# Patient Record
Sex: Female | Born: 1975 | Race: White | Hispanic: No | Marital: Single | Smoking: Current every day smoker
Health system: Southern US, Community
[De-identification: ages and names within clinical notes are randomized; demographics above are authoritative.]

## PROBLEM LIST (undated history)

## (undated) DIAGNOSIS — B009 Herpesviral infection, unspecified: Secondary | ICD-10-CM

## (undated) DIAGNOSIS — F319 Bipolar disorder, unspecified: Secondary | ICD-10-CM

## (undated) DIAGNOSIS — F32A Depression, unspecified: Secondary | ICD-10-CM

## (undated) DIAGNOSIS — F909 Attention-deficit hyperactivity disorder, unspecified type: Secondary | ICD-10-CM

## (undated) DIAGNOSIS — F329 Major depressive disorder, single episode, unspecified: Secondary | ICD-10-CM

## (undated) HISTORY — DX: Attention-deficit hyperactivity disorder, unspecified type: F90.9

## (undated) HISTORY — DX: Depression, unspecified: F32.A

## (undated) HISTORY — DX: Bipolar disorder, unspecified: F31.9

## (undated) HISTORY — PX: INDUCED ABORTION: SHX677

## (undated) HISTORY — DX: Major depressive disorder, single episode, unspecified: F32.9

---

## 2006-07-22 ENCOUNTER — Emergency Department (HOSPITAL_COMMUNITY): Admission: EM | Admit: 2006-07-22 | Discharge: 2006-07-22 | Payer: Self-pay | Admitting: Emergency Medicine

## 2007-04-27 ENCOUNTER — Inpatient Hospital Stay: Payer: Self-pay | Admitting: Unknown Physician Specialty

## 2007-05-01 ENCOUNTER — Other Ambulatory Visit: Payer: Self-pay

## 2008-11-10 ENCOUNTER — Emergency Department: Payer: Self-pay | Admitting: Internal Medicine

## 2008-12-23 ENCOUNTER — Emergency Department (HOSPITAL_COMMUNITY): Admission: EM | Admit: 2008-12-23 | Discharge: 2008-12-23 | Payer: Self-pay | Admitting: Emergency Medicine

## 2009-06-28 ENCOUNTER — Emergency Department: Payer: Self-pay | Admitting: Emergency Medicine

## 2009-11-12 ENCOUNTER — Emergency Department (HOSPITAL_COMMUNITY): Admission: EM | Admit: 2009-11-12 | Discharge: 2009-11-12 | Payer: Self-pay | Admitting: Emergency Medicine

## 2010-01-01 ENCOUNTER — Ambulatory Visit: Payer: Self-pay | Admitting: Unknown Physician Specialty

## 2012-03-22 IMAGING — CR DG CHEST 2V
1 series · 2 of 2 positions shown · non-contrast
Comparison: none

REASON FOR EXAM: bronchitis cough
COMMENTS:

[Series 1: view not recorded · 0.17mm/px · 2 of 2 slices shown]
[im 1/2]
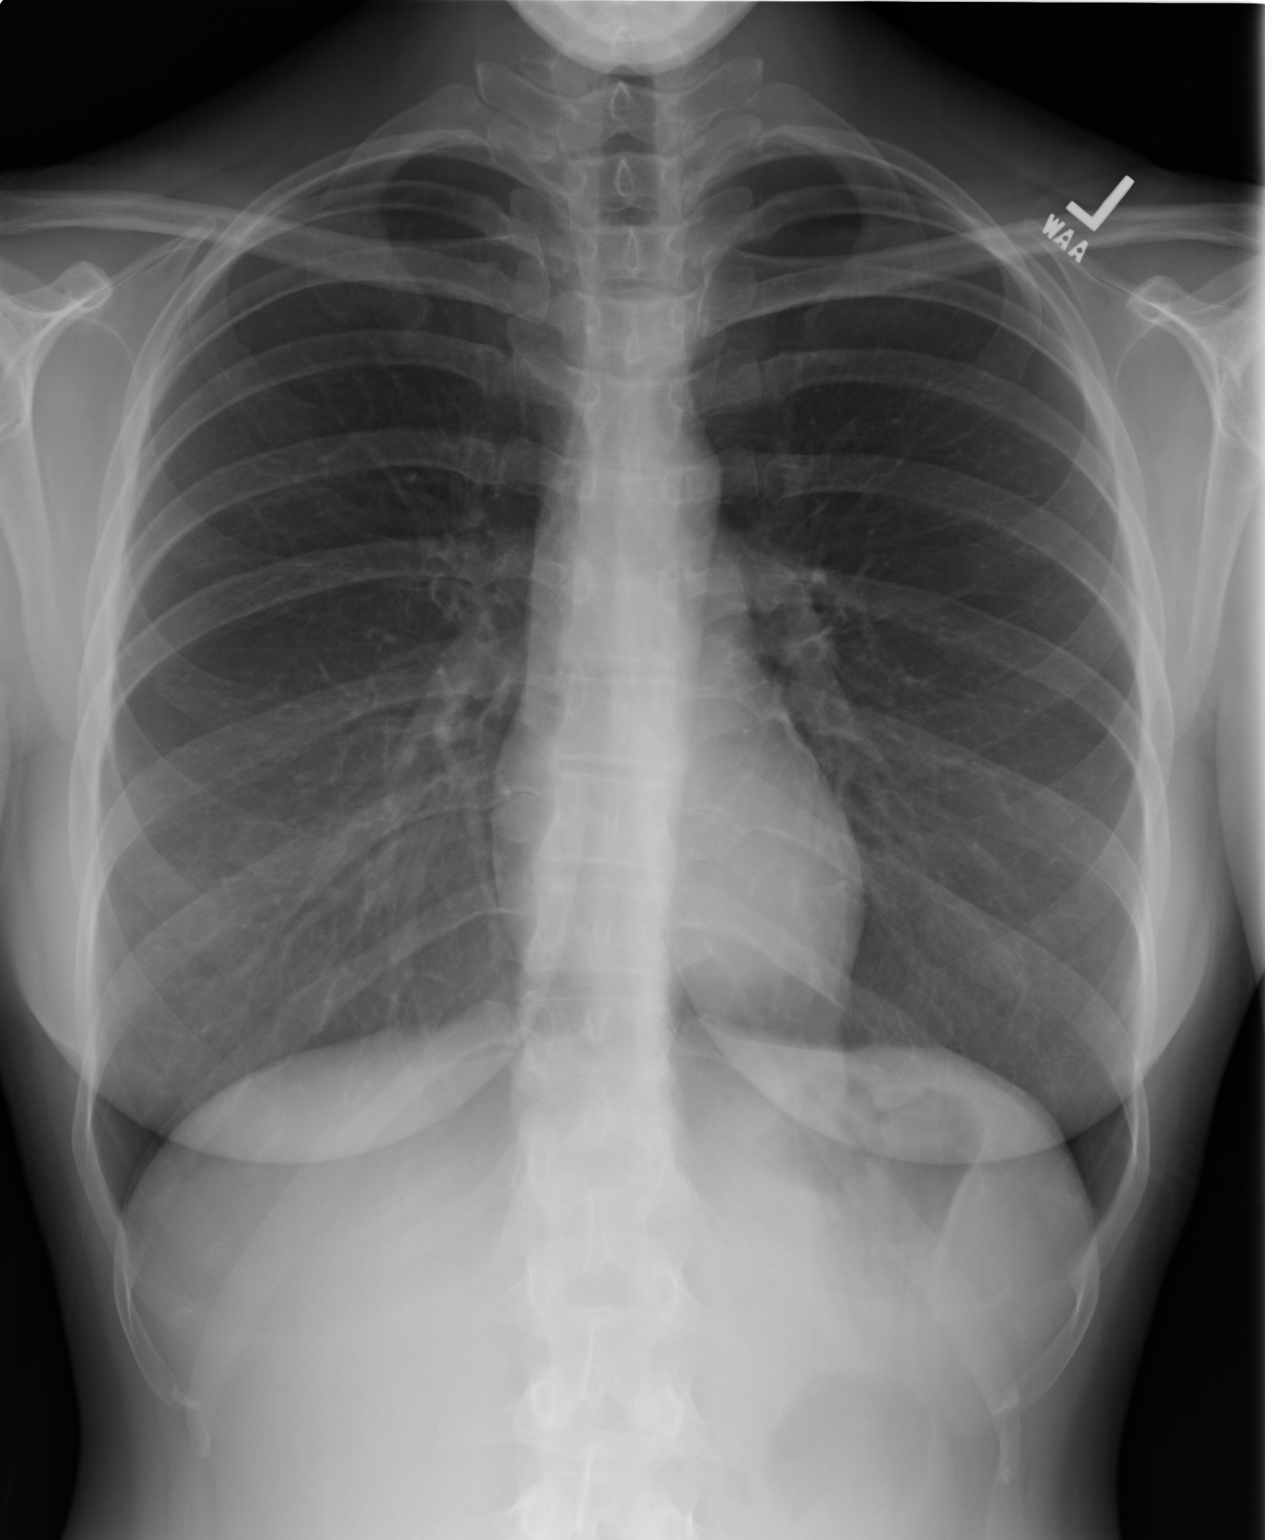
[im 2/2]
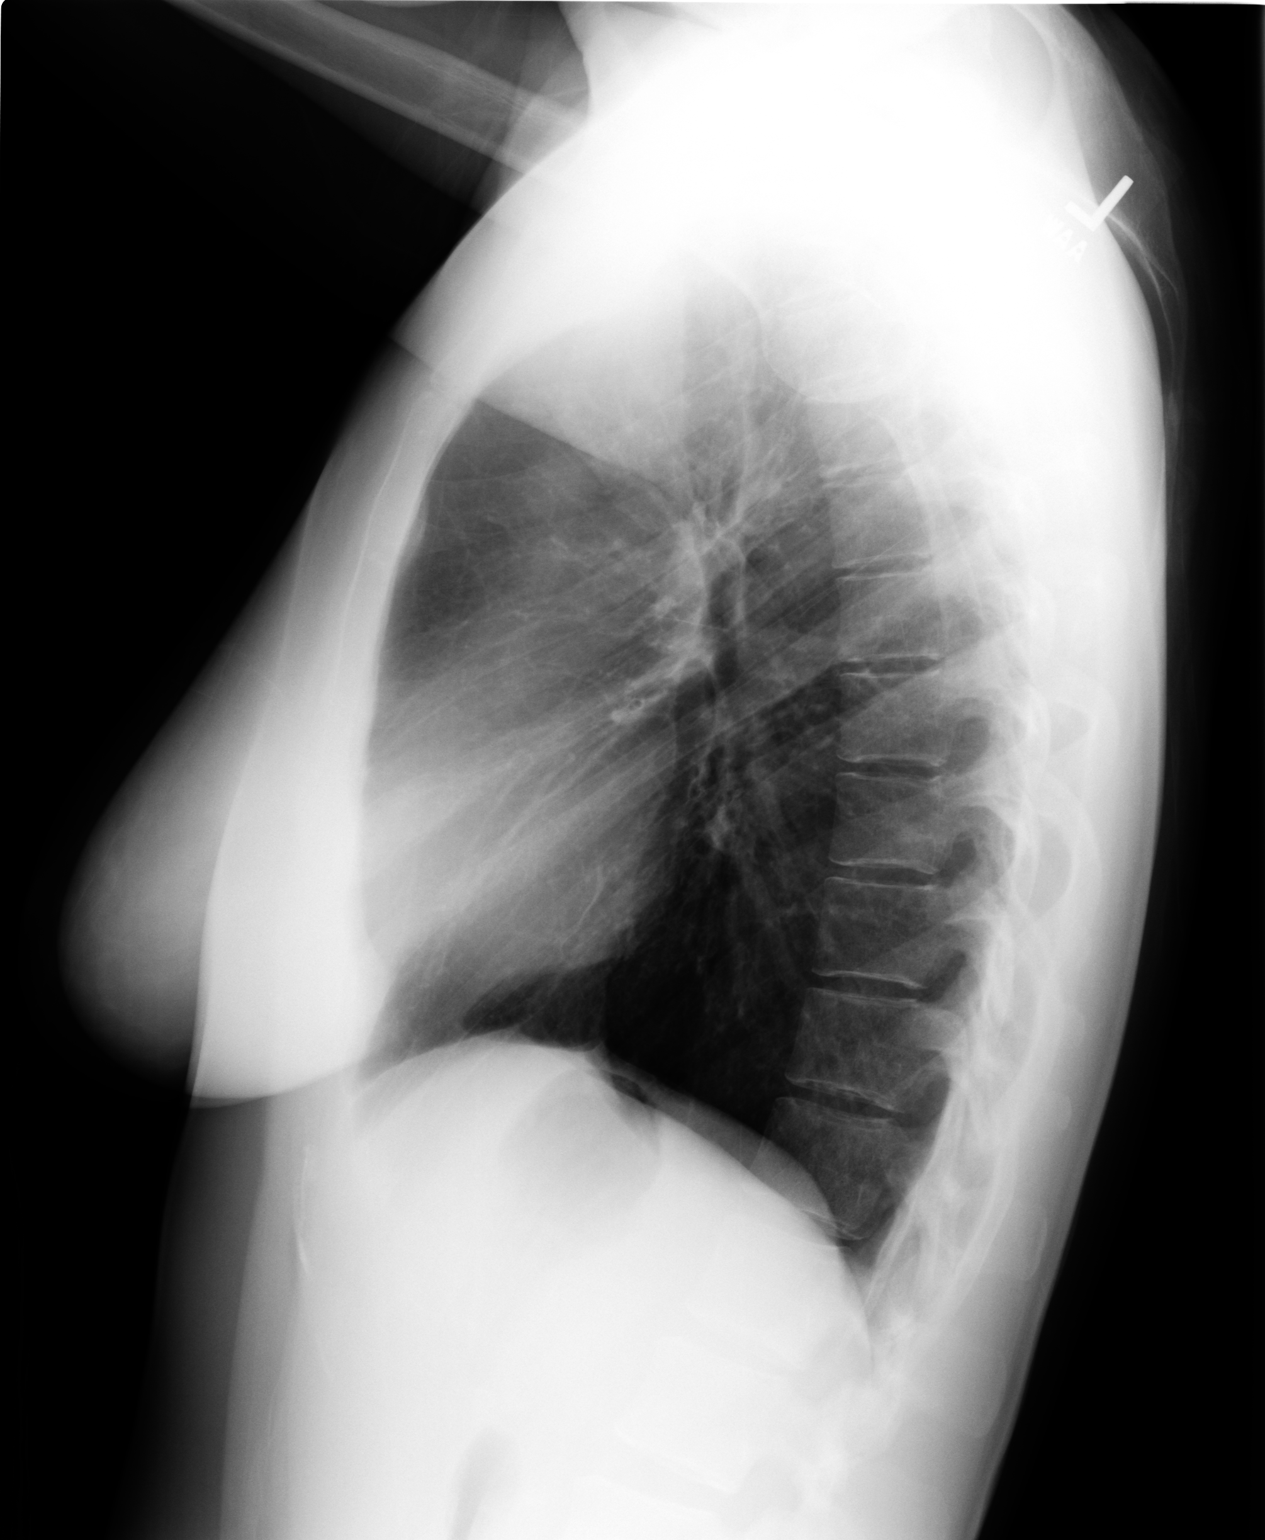

[2 of 2 positions shown; findings below may reference images not displayed]

PROCEDURE:     DXR - DXR CHEST PA (OR AP) AND LATERAL  - January 01, 2010 [DATE]

RESULT:     The lung fields are clear. The heart, mediastinal and osseous
structures show no significant abnormalities. The chest appears mildly
hyperexpanded which may be due to body habitus or to a history of reactive
airway disease.
IMPRESSION: 1.  The lung fields are clear.
2.  The chest appears mildly hyperinflated.

## 2012-12-07 ENCOUNTER — Emergency Department (HOSPITAL_BASED_OUTPATIENT_CLINIC_OR_DEPARTMENT_OTHER)
Admission: EM | Admit: 2012-12-07 | Discharge: 2012-12-07 | Disposition: A | Discharge status: Home / Self Care | Payer: Self-pay | Attending: Emergency Medicine | Admitting: Emergency Medicine

## 2012-12-07 ENCOUNTER — Encounter (HOSPITAL_BASED_OUTPATIENT_CLINIC_OR_DEPARTMENT_OTHER): Payer: Self-pay

## 2012-12-07 ENCOUNTER — Encounter (HOSPITAL_COMMUNITY): Payer: Self-pay | Admitting: Emergency Medicine

## 2012-12-07 ENCOUNTER — Emergency Department (HOSPITAL_COMMUNITY)
Admission: EM | Admit: 2012-12-07 | Discharge: 2012-12-07 | Discharge status: Against Medical Advice | Payer: Self-pay | Attending: Emergency Medicine | Admitting: Emergency Medicine

## 2012-12-07 DIAGNOSIS — N760 Acute vaginitis: Secondary | ICD-10-CM | POA: Insufficient documentation

## 2012-12-07 DIAGNOSIS — F17200 Nicotine dependence, unspecified, uncomplicated: Secondary | ICD-10-CM | POA: Insufficient documentation

## 2012-12-07 DIAGNOSIS — F3130 Bipolar disorder, current episode depressed, mild or moderate severity, unspecified: Secondary | ICD-10-CM | POA: Insufficient documentation

## 2012-12-07 DIAGNOSIS — B9689 Other specified bacterial agents as the cause of diseases classified elsewhere: Secondary | ICD-10-CM | POA: Insufficient documentation

## 2012-12-07 DIAGNOSIS — F319 Bipolar disorder, unspecified: Secondary | ICD-10-CM | POA: Insufficient documentation

## 2012-12-07 DIAGNOSIS — Z8619 Personal history of other infectious and parasitic diseases: Secondary | ICD-10-CM | POA: Insufficient documentation

## 2012-12-07 DIAGNOSIS — N898 Other specified noninflammatory disorders of vagina: Secondary | ICD-10-CM | POA: Insufficient documentation

## 2012-12-07 DIAGNOSIS — Z79899 Other long term (current) drug therapy: Secondary | ICD-10-CM | POA: Insufficient documentation

## 2012-12-07 DIAGNOSIS — A499 Bacterial infection, unspecified: Secondary | ICD-10-CM | POA: Insufficient documentation

## 2012-12-07 HISTORY — DX: Bipolar disorder, unspecified: F31.9

## 2012-12-07 HISTORY — DX: Herpesviral infection, unspecified: B00.9

## 2012-12-07 LAB — URINALYSIS, ROUTINE W REFLEX MICROSCOPIC
Glucose, UA: NEGATIVE mg/dL
Hgb urine dipstick: NEGATIVE
Specific Gravity, Urine: 1.02 (ref 1.005–1.030)
Urobilinogen, UA: 0.2 mg/dL (ref 0.0–1.0)

## 2012-12-07 LAB — HIV ANTIBODY (ROUTINE TESTING W REFLEX): HIV: NONREACTIVE

## 2012-12-07 LAB — URINE MICROSCOPIC-ADD ON

## 2012-12-07 LAB — WET PREP, GENITAL

## 2012-12-07 LAB — POCT PREGNANCY, URINE: Preg Test, Ur: NEGATIVE

## 2012-12-07 LAB — RPR: RPR Ser Ql: NONREACTIVE

## 2012-12-07 MED ORDER — CEFTRIAXONE SODIUM 250 MG IJ SOLR
250.0000 mg | Freq: Once | INTRAMUSCULAR | Status: AC
  Administered 2012-12-07: 250 mg via INTRAMUSCULAR
  Filled 2012-12-07: qty 250

## 2012-12-07 MED ORDER — METRONIDAZOLE 500 MG PO TABS
2000.0000 mg | ORAL_TABLET | Freq: Once | ORAL | Status: AC
  Administered 2012-12-07: 2000 mg via ORAL
  Filled 2012-12-07: qty 4

## 2012-12-07 MED ORDER — AZITHROMYCIN 250 MG PO TABS
1000.0000 mg | ORAL_TABLET | Freq: Once | ORAL | Status: AC
  Administered 2012-12-07: 1000 mg via ORAL
  Filled 2012-12-07: qty 4

## 2012-12-07 NOTE — ED Notes (Signed)
Pt states she is not going to wait any longer, she is hungry and wants to go eat lunch. Informed pt that her lab work had resulted and MD would be able to discuss that with her once she is placed in ED room, she states she just wants to go eat

## 2012-12-07 NOTE — ED Notes (Signed)
Patient returned from CT

## 2012-12-07 NOTE — ED Notes (Signed)
Pt sts lower abd pain; pt sts she could have been exposed to STD; pt sts white vaginal discharge

## 2012-12-07 NOTE — ED Notes (Signed)
Pelvic cart to bedside 

## 2012-12-07 NOTE — ED Provider Notes (Signed)
CSN: [id number]     Arrival date & time 12/07/12  1414 History  This chart was scribed for Gerhard Munch, MD by Greggory Stallion, ED Scribe. This patient was seen in room MH07/MH07 and the patient's care was started at 3:03 PM.   Chief Complaint  Patient presents with  . Abdominal Pain   The history is provided by the patient. No language interpreter was used.    HPI Comments: Katrina Harper is a 37 y.o. female who presents to the Emergency Department complaining of gradual onset, constant lower abdominal pain that started last night. She states she could have been exposed to an STD and also thinks she has PID. She has white vaginal discharge with mild odor that started one month ago. Pt was diagnosed with herpes about one month ago. She states she has intermittent dysuria. [facility] but has not been able to be seen yet. She denise fever, confusion, CP, hematuria, rash, swelling and emesis as associated symptoms.   Psychiatrist is Dr. Lafayette Dragon  Past Medical History  Diagnosis Date  . Herpes   . Bipolar depression    History reviewed. No pertinent past surgical history. No family history on file. History  Substance Use Topics  . Smoking status: Current Every Day Smoker  . Smokeless tobacco: Not on file  . Alcohol Use: No   OB History   Grav Para Term Preterm Abortions TAB SAB Ect Mult Living                 Review of Systems  Constitutional:       Per HPI, otherwise negative  HENT:       Per HPI, otherwise negative  Respiratory:       Per HPI, otherwise negative  Cardiovascular:       Per HPI, otherwise negative  Gastrointestinal: Positive for abdominal pain. Negative for vomiting.  Endocrine:       Negative aside from HPI  Genitourinary:       Neg aside from HPI   Musculoskeletal:       Per HPI, otherwise negative  Skin: Negative.   Neurological: Negative for syncope.  All other systems reviewed and are negative.    Allergies   Lithium; Ortho tri-cyclen; and Lamictal  Home Medications   Current Outpatient Rx  Name  Route  Sig  Dispense  Refill  . amphetamine-dextroamphetamine (ADDERALL) 20 MG tablet   Oral   Take 20 mg by mouth 4 (four) times daily.         . ARIPiprazole (ABILIFY) 30 MG tablet   Oral   Take 30 mg by mouth daily.          BP 120/81  Pulse 110  Temp(Src) 98.5 F (36.9 C) (Oral)  Resp 18  Ht 5\' 6"  (1.676 m)  Wt 150 lb (68.04 kg)  BMI 24.22 kg/m2  SpO2 97%  LMP 11/18/2012  Physical Exam  Nursing note and vitals reviewed. Constitutional: She is oriented to person, place, and time. She appears well-developed and well-nourished. No distress.  HENT:  Head: Normocephalic and atraumatic.  Eyes: Conjunctivae and EOM are normal.  Cardiovascular: Normal rate and regular rhythm.   Pulmonary/Chest: Effort normal and breath sounds normal. No stridor. No respiratory distress.  Abdominal: She exhibits no distension.  RLQ tenderness.   Genitourinary:  External labia unremarkable.  There is copious white discharge about the posterior vaginal vault with no bleeding.  Musculoskeletal: She exhibits no edema.  Neurological: She is alert and oriented to person, place, and time. No cranial nerve deficit.  Skin: Skin is warm and dry.  Psychiatric: She has a normal mood and affect.    ED Course  Pelvic exam Date/Time: 12/07/2012 4:00 PM Performed by: Gerhard Munch Authorized by: Gerhard Munch Consent: Verbal consent obtained. The procedure was performed in an emergent situation. Risks and benefits: risks, benefits and alternatives were discussed Consent given by: patient Patient understanding: patient states understanding of the procedure being performed Patient consent: the patient's understanding of the procedure matches consent given Procedure consent: procedure consent matches procedure scheduled Relevant documents: relevant documents present and verified Test results: test  results available and properly labeled Site marked: the operative site was marked Imaging studies: imaging studies available Required items: required blood products, implants, devices, and special equipment available Patient identity confirmed: verbally with patient Time out: Immediately prior to procedure a "time out" was called to verify the correct patient, procedure, equipment, support staff and site/side marked as required. Preparation: Patient was prepped and draped in the usual sterile fashion. Local anesthesia used: no Patient sedated: no Patient tolerance: Patient tolerated the procedure well with no immediate complications. Comments: White, frothy discharge, No CMT, minimal adnexal ttp, bilaterally.   (including critical care time)  DIAGNOSTIC STUDIES: Oxygen Saturation is 97% on RA, normal by my interpretation.    COORDINATION OF CARE: 3:16 PM-Discussed treatment plan which includes pelvic exam with pt at bedside and pt agreed to plan.   Labs Review Labs Reviewed  WET PREP, GENITAL - Abnormal; Notable for the following:    Clue Cells Wet Prep HPF POC MODERATE (*)    WBC, Wet Prep HPF POC MANY (*)    All other components within normal limits  GC/CHLAMYDIA PROBE AMP  RPR  HIV ANTIBODY (ROUTINE TESTING)   Imaging Review No results found.   Update: Patient aware of lab results thus far, and pending results. She'll be treated empirically for gonorrhea Chlamydia, and will receive metronidazole for her trichomonas infection.  MDM  No diagnosis found.  I personally performed the services described in this documentation, which was scribed in my presence. The recorded information has been reviewed and is accurate.   Patient presents with abdominal pain, dysuria, concern for STD exposure.  Given her concern, she was treated empirically for gonorrhea and Chlamydia.  Initial results are also demonstrated presence of trichomoniasis.  Absent distress, fever, there is low  suspicion for systemic infection or TOA.  Patient discharged with women's health followup.    Gerhard Munch, MD 12/07/12 1755

## 2012-12-07 NOTE — ED Notes (Addendum)
C/o abd pain started last night-pt states she think she has PID-pos vaginal d/c-pt left [facility] to come due to long wait-reports urine was sent

## 2012-12-09 LAB — URINE CULTURE

## 2013-11-25 ENCOUNTER — Encounter (HOSPITAL_BASED_OUTPATIENT_CLINIC_OR_DEPARTMENT_OTHER): Payer: Self-pay | Admitting: Emergency Medicine

## 2013-11-25 ENCOUNTER — Emergency Department (HOSPITAL_BASED_OUTPATIENT_CLINIC_OR_DEPARTMENT_OTHER)
Admission: EM | Admit: 2013-11-25 | Discharge: 2013-11-25 | Disposition: A | Discharge status: Home / Self Care | Payer: Self-pay | Attending: Emergency Medicine | Admitting: Emergency Medicine

## 2013-11-25 DIAGNOSIS — J018 Other acute sinusitis: Secondary | ICD-10-CM

## 2013-11-25 DIAGNOSIS — Z79899 Other long term (current) drug therapy: Secondary | ICD-10-CM | POA: Insufficient documentation

## 2013-11-25 DIAGNOSIS — R509 Fever, unspecified: Secondary | ICD-10-CM | POA: Insufficient documentation

## 2013-11-25 DIAGNOSIS — F3130 Bipolar disorder, current episode depressed, mild or moderate severity, unspecified: Secondary | ICD-10-CM | POA: Insufficient documentation

## 2013-11-25 DIAGNOSIS — Z87891 Personal history of nicotine dependence: Secondary | ICD-10-CM | POA: Insufficient documentation

## 2013-11-25 DIAGNOSIS — Z8619 Personal history of other infectious and parasitic diseases: Secondary | ICD-10-CM | POA: Insufficient documentation

## 2013-11-25 MED ORDER — AMOXICILLIN-POT CLAVULANATE 875-125 MG PO TABS
1.0000 | ORAL_TABLET | Freq: Once | ORAL | Status: AC
  Administered 2013-11-25: 1 via ORAL
  Filled 2013-11-25: qty 1

## 2013-11-25 MED ORDER — AMOXICILLIN-POT CLAVULANATE 875-125 MG PO TABS: 1.0000 | ORAL_TABLET | Freq: Two times a day (BID) | ORAL | Status: DC

## 2013-11-25 NOTE — ED Notes (Signed)
Fever, cough and facial pain x 1 week.

## 2013-11-25 NOTE — ED Provider Notes (Signed)
CSN: [id number]     Arrival date & time 11/25/13  1504 History   First MD Initiated Contact with Patient 11/25/13 1535     Chief Complaint  Patient presents with  . Fever     (Consider location/radiation/quality/duration/timing/severity/associated sxs/prior Treatment) Patient is a 38 y.o. female presenting with fever. The history is provided by the patient.  Fever Max temp prior to arrival:  102 Temp source:  Oral Severity:  Mild Onset quality:  Sudden Duration:  6 days Timing:  Constant Progression:  Worsening Chronicity:  New Relieved by:  Nothing Worsened by:  Nothing tried Ineffective treatments:  None tried Associated symptoms: congestion and cough   Associated symptoms: no chest pain, no diarrhea, no dysuria, no headaches, no nausea and no vomiting   Congestion:    Location:  Nasal Cough:    Cough characteristics:  Productive   Sputum characteristics:  Green   Severity:  Mild   Onset quality:  Sudden   Duration:  6 days   Timing:  Constant   Progression:  Unchanged   Chronicity:  New   Past Medical History  Diagnosis Date  . Herpes   . Bipolar depression    History reviewed. No pertinent past surgical history. No family history on file. History  Substance Use Topics  . Smoking status: Former Games developer  . Smokeless tobacco: Not on file  . Alcohol Use: No   OB History   Grav Para Term Preterm Abortions TAB SAB Ect Mult Living                 Review of Systems  Constitutional: Positive for fever. Negative for fatigue.  HENT: Positive for congestion. Negative for drooling.   Eyes: Negative for pain.  Respiratory: Positive for cough. Negative for shortness of breath.   Cardiovascular: Negative for chest pain.  Gastrointestinal: Negative for nausea, vomiting, abdominal pain and diarrhea.  Genitourinary: Negative for dysuria and hematuria.  Musculoskeletal: Negative for back pain, gait problem and neck pain.  Skin: Negative for color change.   Neurological: Negative for dizziness and headaches.  Hematological: Negative for adenopathy.  Psychiatric/Behavioral: Negative for behavioral problems.  All other systems reviewed and are negative.     Allergies  Lithium; Ortho tri-cyclen; and Lamictal  Home Medications   Prior to Admission medications   Medication Sig Start Date End Date Taking? Authorizing Provider  amphetamine-dextroamphetamine (ADDERALL) 20 MG tablet Take 20 mg by mouth 4 (four) times daily.    Historical Provider, MD  ARIPiprazole (ABILIFY) 30 MG tablet Take 30 mg by mouth daily.    Historical Provider, MD   BP 112/75  Pulse 101  Temp(Src) 98 F (36.7 C) (Oral)  Resp 20  Ht  (1.676 m)  Wt 161 lb (73.029 kg)  BMI 26.00 kg/m2  SpO2 100%  LMP 11/18/2013 Physical Exam  Nursing note and vitals reviewed. Constitutional: She is oriented to person, place, and time. She appears well-developed and well-nourished.  HENT:  Head: Normocephalic and atraumatic.  Right Ear: External ear normal.  Left Ear: External ear normal.  Mouth/Throat: Oropharynx is clear and moist. No oropharyngeal exudate.  Mild ttp of maxilla bilaterally.   Eyes: Conjunctivae and EOM are normal. Pupils are equal, round, and reactive to light.  Neck: Normal range of motion. Neck supple.  Cardiovascular: Normal rate, regular rhythm, normal heart sounds and intact distal pulses.  Exam reveals no gallop and no friction rub.   No murmur heard. Pulmonary/Chest: Effort normal and breath sounds normal. No  respiratory distress. She has no wheezes.  Abdominal: Soft. Bowel sounds are normal. There is no tenderness. There is no rebound and no guarding.  Musculoskeletal: Normal range of motion. She exhibits no edema and no tenderness.  Neurological: She is alert and oriented to person, place, and time.  Skin: Skin is warm and dry.  Psychiatric: She has a normal mood and affect. Her behavior is normal.    ED Course  Procedures (including  critical care time) Labs Review Labs Reviewed - No data to display  Imaging Review No results found.   EKG Interpretation None      MDM   Final diagnoses:  Other acute sinusitis    3:48 PM 38 y.o. female who pw facial pressure, prod cough, sinus cong x 6 days. Has had fever up to 102 at home. Took tylenol pta. AFVSS here. Possibly sinus infection given fever. She denies sob. Will tx w/ augmentin.   3:51 PM:  I have discussed the diagnosis/risks/treatment options with the patient and believe the pt to be eligible for discharge home to follow-up with her pcp or estab w/ one. We also discussed returning to the ED immediately if new or worsening sx occur. We discussed the sx which are most concerning (e.g., worsening congestion, intractable fever, sob) that necessitate immediate return. Medications administered to the patient during their visit and any new prescriptions provided to the patient are listed below.  Medications given during this visit Medications  amoxicillin-clavulanate (AUGMENTIN) 875-125 MG per tablet 1 tablet (not administered)    New Prescriptions   AMOXICILLIN-CLAVULANATE (AUGMENTIN) 875-125 MG PER TABLET    Take 1 tablet by mouth 2 (two) times daily. One po bid x 7 days       Purvis Sheffield, MD 11/25/13 [phone]

## 2013-11-25 NOTE — Discharge Instructions (Signed)

## 2014-04-06 ENCOUNTER — Ambulatory Visit (INDEPENDENT_AMBULATORY_CARE_PROVIDER_SITE_OTHER): Payer: Self-pay | Admitting: Obstetrics and Gynecology

## 2014-04-06 ENCOUNTER — Encounter: Payer: Self-pay | Admitting: Obstetrics and Gynecology

## 2014-04-06 VITALS — BP 124/97 | HR 85 | Ht 66.0 in | Wt 161.0 lb

## 2014-04-06 DIAGNOSIS — Z30011 Encounter for initial prescription of contraceptive pills: Secondary | ICD-10-CM

## 2014-04-06 MED ORDER — NORETHINDRONE 0.35 MG PO TABS: 1.0000 | ORAL_TABLET | Freq: Every day | ORAL | Status: DC

## 2014-04-06 NOTE — Progress Notes (Signed)
Patient ID: Katrina Harper, female   DOB: 05/04/1975, 39 y.o.   MRN: [id number] 39 yo [id number] here requesting birth control prescription. Patient has taken progesterone only pills since September 2015 secondary to HTN and would like to continue. She is without any other complaints.   Past Medical History  Diagnosis Date  . Herpes   . Bipolar depression    History reviewed. No pertinent past surgical history. Family History  Problem Relation Age of Onset  . Hypertension Mother   . Cancer Father     melanoma on eye  . Hypertension Father    History  Substance Use Topics  . Smoking status: Former Games developermoker  . Smokeless tobacco: Never Used  . Alcohol Use: No   GENERAL: Well-developed, well-nourished female in no acute distress.  EXTREMITIES: No cyanosis, clubbing, or edema, 2+ distal pulses.  A/P 39 yo here for contraception - Rx Lyza given - Discussed other progesterone only methods and patient is interested in IUD (has not decided on Mirena vs Copper). - RTC prn

## 2014-05-10 ENCOUNTER — Encounter: Payer: Self-pay | Admitting: Family Medicine

## 2014-05-10 ENCOUNTER — Ambulatory Visit (INDEPENDENT_AMBULATORY_CARE_PROVIDER_SITE_OTHER): Payer: Commercial Indemnity | Admitting: Family Medicine

## 2014-05-10 VITALS — BP 123/86 | HR 79 | Wt 158.0 lb

## 2014-05-10 DIAGNOSIS — Z113 Encounter for screening for infections with a predominantly sexual mode of transmission: Secondary | ICD-10-CM

## 2014-05-10 DIAGNOSIS — Z3201 Encounter for pregnancy test, result positive: Secondary | ICD-10-CM

## 2014-05-10 DIAGNOSIS — O9989 Other specified diseases and conditions complicating pregnancy, childbirth and the puerperium: Secondary | ICD-10-CM

## 2014-05-10 DIAGNOSIS — A6000 Herpesviral infection of urogenital system, unspecified: Secondary | ICD-10-CM

## 2014-05-10 DIAGNOSIS — R109 Unspecified abdominal pain: Secondary | ICD-10-CM

## 2014-05-10 DIAGNOSIS — Z7251 High risk heterosexual behavior: Secondary | ICD-10-CM

## 2014-05-10 DIAGNOSIS — O26899 Other specified pregnancy related conditions, unspecified trimester: Secondary | ICD-10-CM

## 2014-05-10 DIAGNOSIS — Z331 Pregnant state, incidental: Secondary | ICD-10-CM

## 2014-05-10 DIAGNOSIS — O3680X1 Pregnancy with inconclusive fetal viability, fetus 1: Secondary | ICD-10-CM

## 2014-05-10 LAB — POCT URINE PREGNANCY: Preg Test, Ur: POSITIVE

## 2014-05-10 MED ORDER — ACYCLOVIR 200 MG PO CAPS: 200.0000 mg | ORAL_CAPSULE | Freq: Three times a day (TID) | ORAL | Status: DC

## 2014-05-10 NOTE — Patient Instructions (Signed)
Intrauterine Device Information An intrauterine device (IUD) is inserted into your uterus to prevent pregnancy. There are two types of IUDs available:   Copper IUD--This type of IUD is wrapped in copper wire and is placed inside the uterus. Copper makes the uterus and fallopian tubes produce a fluid that kills sperm. The copper IUD can stay in place for 10 years.  Hormone IUD--This type of IUD contains the hormone progestin (synthetic progesterone). The hormone thickens the cervical mucus and prevents sperm from entering the uterus. It also thins the uterine lining to prevent implantation of a fertilized egg. The hormone can weaken or kill the sperm that get into the uterus. One type of hormone IUD can stay in place for 5 years, and another type can stay in place for 3 years. Your health care provider will make sure you are a good candidate for a contraceptive IUD. Discuss with your health care provider the possible side effects.  ADVANTAGES OF AN INTRAUTERINE DEVICE  IUDs are highly effective, reversible, long acting, and low maintenance.   There are no estrogen-related side effects.   An IUD can be used when breastfeeding.   IUDs are not associated with weight gain.   The copper IUD works immediately after insertion.   The hormone IUD works right away if inserted within 7 days of your period starting. You will need to use a backup method of birth control for 7 days if the hormone IUD is inserted at any other time in your cycle.  The copper IUD does not interfere with your female hormones.   The hormone IUD can make heavy menstrual periods lighter and decrease cramping.   The hormone IUD can be used for 3 or 5 years.   The copper IUD can be used for 10 years. DISADVANTAGES OF AN INTRAUTERINE DEVICE  The hormone IUD can be associated with irregular bleeding patterns.   The copper IUD can make your menstrual flow heavier and more painful.   You may experience cramping and  vaginal bleeding after insertion.  Document Released: 02/20/2004 Document Revised: 11/18/2012 Document Reviewed: 09/06/2012 [facility] Patient Information 2015 [employer], [place]. This information is not intended to replace advice given to you by your health care provider. Make sure you discuss any questions you have with your health care provider. Herpes Simplex Herpes simplex is generally classified as Type 1 or Type 2. Type 1 is generally the type that is responsible for cold sores. Type 2 is generally associated with sexually transmitted diseases. We now know that most of the thoughts on these viruses are inaccurate. We find that HSV1 is also present genitally and HSV2 can be present orally, but this will vary in different locations of the world. Herpes simplex is usually detected by doing a culture. Blood tests are also available for this virus; however, the accuracy is often not as good.  PREPARATION FOR TEST No preparation or fasting is necessary. NORMAL FINDINGS  No virus present  No HSV antigens or antibodies present Ranges for normal findings may vary among different laboratories and hospitals. You should always check with your doctor after having lab work or other tests done to discuss the meaning of your test results and whether your values are considered within normal limits. MEANING OF TEST  Your caregiver will go over the test results with you and discuss the importance and meaning of your results, as well as treatment options and the need for additional tests if necessary. OBTAINING THE TEST RESULTS  It is your  responsibility to obtain your test results. Ask the lab or department performing the test when and how you will get your results. Document Released: 04/20/2004 Document Revised: 06/10/2011 Document Reviewed: 02/27/2008 [facility] Patient Information 2015 [place], [place]. This information is not intended to replace advice given to you by your health care provider. Make sure you  discuss any questions you have with your health care provider.

## 2014-05-10 NOTE — Progress Notes (Signed)
    Subjective:    Patient ID: Katrina Harper is a 39 y.o. female presenting with Vaginal Discharge  on 05/10/2014  HPI: Recently started on progestin only OC's. Using condoms.  LMP was 1 month ago. Partner cheated.  HIV test negatvie at [facility] last week. [facility] discharge. Wants full STD testing. Reports pain x 3 days, too bad to even stand up, has had to miss work and desires a note.   Review of Systems  Constitutional: Negative for fever and chills.  Respiratory: Negative for shortness of breath.   Cardiovascular: Negative for chest pain.  Gastrointestinal: Negative for nausea, vomiting and abdominal pain.  Genitourinary: Negative for dysuria.  Skin: Negative for rash.      Objective:    BP 123/86 mmHg  Pulse 79  Wt 158 lb (71.668 kg)  LMP 04/10/2014 Physical Exam  Constitutional: She is oriented to person, place, and time. She appears well-developed and well-nourished. No distress.  HENT:  Head: Normocephalic and atraumatic.  Eyes: No scleral icterus.  Neck: Neck supple.  Cardiovascular: Normal rate.   Pulmonary/Chest: Effort normal.  Abdominal: Soft.  Neurological: She is alert and oriented to person, place, and time.  Skin: Skin is warm and dry.  Psychiatric: She has a normal mood and affect.    UPT is positive TVUS reveals a intrauterine gestational sac with a yolk sac.  Fetal pole is not yet visualized.  There is a small corpus luteum noted on right. No free fluid.    Assessment & Plan:   Problem List Items Addressed This Visit      Unprioritized   Genital herpes   Relevant Medications   acyclovir (ZOVIRAX) 200 MG capsule    Other Visit Diagnoses    High risk sexual behavior    -  Primary    Relevant Orders    POCT urine pregnancy (Completed)    GC/Chlamydia Probe Amp    Wet prep, genital    Pregnancy, incidental        Pt. states she will terminate this pregnancy    Abdominal pain in pregnancy        No evidence of ectopic--appears intrauterine.        Return in about 3 months (around 08/08/2014) for a follow-up.

## 2014-05-11 LAB — GC/CHLAMYDIA PROBE AMP
CT PROBE, AMP APTIMA: NEGATIVE
GC Probe RNA: NEGATIVE

## 2014-05-11 LAB — WET PREP, GENITAL
Clue Cells Wet Prep HPF POC: NONE SEEN
Trich, Wet Prep: NONE SEEN
Yeast Wet Prep HPF POC: NONE SEEN

## 2014-06-01 ENCOUNTER — Emergency Department (HOSPITAL_BASED_OUTPATIENT_CLINIC_OR_DEPARTMENT_OTHER)
Admission: EM | Admit: 2014-06-01 | Discharge: 2014-06-01 | Disposition: A | Discharge status: Home / Self Care | Payer: Commercial Indemnity | Attending: Emergency Medicine | Admitting: Emergency Medicine

## 2014-06-01 ENCOUNTER — Encounter (HOSPITAL_BASED_OUTPATIENT_CLINIC_OR_DEPARTMENT_OTHER): Payer: Self-pay | Admitting: Emergency Medicine

## 2014-06-01 DIAGNOSIS — F319 Bipolar disorder, unspecified: Secondary | ICD-10-CM | POA: Diagnosis not present

## 2014-06-01 DIAGNOSIS — J069 Acute upper respiratory infection, unspecified: Secondary | ICD-10-CM | POA: Diagnosis not present

## 2014-06-01 DIAGNOSIS — Z793 Long term (current) use of hormonal contraceptives: Secondary | ICD-10-CM | POA: Diagnosis not present

## 2014-06-01 DIAGNOSIS — Z79899 Other long term (current) drug therapy: Secondary | ICD-10-CM | POA: Insufficient documentation

## 2014-06-01 DIAGNOSIS — Z8619 Personal history of other infectious and parasitic diseases: Secondary | ICD-10-CM | POA: Insufficient documentation

## 2014-06-01 DIAGNOSIS — R05 Cough: Secondary | ICD-10-CM | POA: Diagnosis present

## 2014-06-01 DIAGNOSIS — Z87891 Personal history of nicotine dependence: Secondary | ICD-10-CM | POA: Insufficient documentation

## 2014-06-01 DIAGNOSIS — B9789 Other viral agents as the cause of diseases classified elsewhere: Secondary | ICD-10-CM

## 2014-06-01 MED ORDER — BENZONATATE 100 MG PO CAPS: 100.0000 mg | ORAL_CAPSULE | Freq: Three times a day (TID) | ORAL | Status: DC

## 2014-06-01 MED ORDER — FLUTICASONE PROPIONATE 50 MCG/ACT NA SUSP: 2.0000 | Freq: Every day | NASAL | Status: DC

## 2014-06-01 NOTE — ED Notes (Signed)
Pa  at bedside. 

## 2014-06-01 NOTE — Discharge Instructions (Signed)
Take tessalon as prescribed for cough. Use flonase as prescribed. Continue mucinex. Stay well hydrated.  Upper Respiratory Infection, Adult An upper respiratory infection (URI) is also sometimes known as the common cold. The upper respiratory tract includes the nose, sinuses, throat, trachea, and bronchi. Bronchi are the airways leading to the lungs. Most people improve within 1 week, but symptoms can last up to 2 weeks. A residual cough may last even longer.  CAUSES Many different viruses can infect the tissues lining the upper respiratory tract. The tissues become irritated and inflamed and often become very moist. Mucus production is also common. A cold is contagious. You can easily spread the virus to others by oral contact. This includes kissing, sharing a glass, coughing, or sneezing. Touching your mouth or nose and then touching a surface, which is then touched by another person, can also spread the virus. SYMPTOMS  Symptoms typically develop 1 to 3 days after you come in contact with a cold virus. Symptoms vary from person to person. They may include:  Runny nose.  Sneezing.  Nasal congestion.  Sinus irritation.  Sore throat.  Loss of voice (laryngitis).  Cough.  Fatigue.  Muscle aches.  Loss of appetite.  Headache.  Low-grade fever. DIAGNOSIS  You might diagnose your own cold based on familiar symptoms, since most people get a cold 2 to 3 times a year. Your caregiver can confirm this based on your exam. Most importantly, your caregiver can check that your symptoms are not due to another disease such as strep throat, sinusitis, pneumonia, asthma, or epiglottitis. Blood tests, throat tests, and X-rays are not necessary to diagnose a common cold, but they may sometimes be helpful in excluding other more serious diseases. Your caregiver will decide if any further tests are required. RISKS AND COMPLICATIONS  You may be at risk for a more severe case of the common cold if you  smoke cigarettes, have chronic heart disease (such as heart failure) or lung disease (such as asthma), or if you have a weakened immune system. The very young and very old are also at risk for more serious infections. Bacterial sinusitis, middle ear infections, and bacterial pneumonia can complicate the common cold. The common cold can worsen asthma and chronic obstructive pulmonary disease (COPD). Sometimes, these complications can require emergency medical care and may be life-threatening. PREVENTION  The best way to protect against getting a cold is to practice good hygiene. Avoid oral or hand contact with people with cold symptoms. Wash your hands often if contact occurs. There is no clear evidence that vitamin C, vitamin E, echinacea, or exercise reduces the chance of developing a cold. However, it is always recommended to get plenty of rest and practice good nutrition. TREATMENT  Treatment is directed at relieving symptoms. There is no cure. Antibiotics are not effective, because the infection is caused by a virus, not by bacteria. Treatment may include:  Increased fluid intake. Sports drinks offer valuable electrolytes, sugars, and fluids.  Breathing heated mist or steam (vaporizer or shower).  Eating chicken soup or other clear broths, and maintaining good nutrition.  Getting plenty of rest.  Using gargles or lozenges for comfort.  Controlling fevers with ibuprofen or acetaminophen as directed by your caregiver.  Increasing usage of your inhaler if you have asthma. Zinc gel and zinc lozenges, taken in the first 24 hours of the common cold, can shorten the duration and lessen the severity of symptoms. Pain medicines may help with fever, muscle aches, and throat  pain. A variety of non-prescription medicines are available to treat congestion and runny nose. Your caregiver can make recommendations and may suggest nasal or lung inhalers for other symptoms.  HOME CARE INSTRUCTIONS   Only take  over-the-counter or prescription medicines for pain, discomfort, or fever as directed by your caregiver.  Use a warm mist humidifier or inhale steam from a shower to increase air moisture. This may keep secretions moist and make it easier to breathe.  Drink enough water and fluids to keep your urine clear or pale yellow.  Rest as needed.  Return to work when your temperature has returned to normal or as your caregiver advises. You may need to stay home longer to avoid infecting others. You can also use a face mask and careful hand washing to prevent spread of the virus. SEEK MEDICAL CARE IF:   After the first few days, you feel you are getting worse rather than better.  You need your caregiver's advice about medicines to control symptoms.  You develop chills, worsening shortness of breath, or brown or red sputum. These may be signs of pneumonia.  You develop yellow or brown nasal discharge or pain in the face, especially when you bend forward. These may be signs of sinusitis.  You develop a fever, swollen neck glands, pain with swallowing, or white areas in the back of your throat. These may be signs of strep throat. SEEK IMMEDIATE MEDICAL CARE IF:   You have a fever.  You develop severe or persistent headache, ear pain, sinus pain, or chest pain.  You develop wheezing, a prolonged cough, cough up blood, or have a change in your usual mucus (if you have chronic lung disease).  You develop sore muscles or a stiff neck. Document Released: 09/11/2000 Document Revised: 06/10/2011 Document Reviewed: 06/23/2013 [facility] Patient Information 2015 [place], [place]. This information is not intended to replace advice given to you by your health care provider. Make sure you discuss any questions you have with your health care provider.  Cough, Adult  A cough is a reflex that helps clear your throat and airways. It can help heal the body or may be a reaction to an irritated airway. A cough may  only last 2 or 3 weeks (acute) or may last more than 8 weeks (chronic).  CAUSES Acute cough:  Viral or bacterial infections. Chronic cough:  Infections.  Allergies.  Asthma.  Post-nasal drip.  Smoking.  Heartburn or acid reflux.  Some medicines.  Chronic lung problems (COPD).  Cancer. SYMPTOMS   Cough.  Fever.  Chest pain.  Increased breathing rate.  High-pitched whistling sound when breathing (wheezing).  Colored mucus that you cough up (sputum). TREATMENT   A bacterial cough may be treated with antibiotic medicine.  A viral cough must run its course and will not respond to antibiotics.  Your caregiver may recommend other treatments if you have a chronic cough. HOME CARE INSTRUCTIONS   Only take over-the-counter or prescription medicines for pain, discomfort, or fever as directed by your caregiver. Use cough suppressants only as directed by your caregiver.  Use a cold steam vaporizer or humidifier in your bedroom or home to help loosen secretions.  Sleep in a semi-upright position if your cough is worse at night.  Rest as needed.  Stop smoking if you smoke. SEEK IMMEDIATE MEDICAL CARE IF:   You have pus in your sputum.  Your cough starts to worsen.  You cannot control your cough with suppressants and are losing sleep.  You begin coughing up blood.  You have difficulty breathing.  You develop pain which is getting worse or is uncontrolled with medicine.  You have a fever. MAKE SURE YOU:   Understand these instructions.  Will watch your condition.  Will get help right away if you are not doing well or get worse. Document Released: 09/14/2010 Document Revised: 06/10/2011 Document Reviewed: 09/14/2010 [facility] Patient Information 2015 [place], [place]. This information is not intended to replace advice given to you by your health care provider. Make sure you discuss any questions you have with your health care provider.

## 2014-06-01 NOTE — ED Notes (Signed)
Pt states cough, productive, green; fever at night x 3 days

## 2014-06-01 NOTE — ED Provider Notes (Signed)
CSN: [id number]     Arrival date & time 06/01/14  1812 History   First MD Initiated Contact with Patient 06/01/14 1837     Chief Complaint  Patient presents with  . URI     (Consider location/radiation/quality/duration/timing/severity/associated sxs/prior Treatment) HPI Comments: 39 year old female complaining of productive cough with green phlegm and subjective fevers at night 3 days. Symptoms worse at night, states she lays down to sleep and there is a "tickle" in her throat causing her to cough. States she can hear wheezing when she lays down. She has tried over-the-counter Mucinex and Zicam with minimal relief. Reports staying well hydrated. No sick contacts.  Patient is a 39 y.o. female presenting with URI. The history is provided by the patient.  URI Presenting symptoms: cough and fever   Timing:  Constant Progression:  Unchanged Chronicity:  New Relieved by:  Nothing Ineffective treatments:  OTC medications   Past Medical History  Diagnosis Date  . Herpes   . Bipolar depression    History reviewed. No pertinent past surgical history. Family History  Problem Relation Age of Onset  . Hypertension Mother   . Cancer Father     melanoma on eye  . Hypertension Father    History  Substance Use Topics  . Smoking status: Former Games developermoker  . Smokeless tobacco: Never Used  . Alcohol Use: No   OB History    Gravida Para Term Preterm AB TAB SAB Ectopic Multiple Living   2    2 2          Review of Systems  Constitutional: Positive for fever and chills.  Respiratory: Positive for cough.   All other systems reviewed and are negative.     Allergies  Lithium; Ortho tri-cyclen; and Lamictal  Home Medications   Prior to Admission medications   Medication Sig Start Date End Date Taking? Authorizing Provider  acyclovir (ZOVIRAX) 200 MG capsule Take 1 capsule (200 mg total) by mouth 3 (three) times daily. 05/10/14   Reva Boresanya S Pratt, MD  amphetamine-dextroamphetamine (ADDERALL)  20 MG tablet Take 20 mg by mouth 4 (four) times daily.    Historical Provider, MD  ARIPiprazole (ABILIFY) 30 MG tablet Take 30 mg by mouth daily.    Historical Provider, MD  benzonatate (TESSALON) 100 MG capsule Take 1 capsule (100 mg total) by mouth every 8 (eight) hours. 06/01/14   Y[place] Pacella M Renlee Floor, PA-C  fluticasone (FLONASE) 50 MCG/ACT nasal spray Place 2 sprays into both nostrils daily. 06/01/14   Massie Mees M Kylen Ismael, PA-C  norethindrone (MICRONOR,CAMILA,ERRIN) 0.35 MG tablet Take 1 tablet (0.35 mg total) by mouth daily. 04/06/14   Peggy Constant, MD   BP 107/67 mmHg  Pulse 94  Temp(Src) 98 F (36.7 C) (Oral)  Resp 16  SpO2 100%  LMP 06/01/2014 Physical Exam  Constitutional: She is oriented to person, place, and time. She appears well-developed and well-nourished. No distress.  HENT:  Head: Normocephalic and atraumatic.  Right Ear: Tympanic membrane normal.  Left Ear: Tympanic membrane normal.  Mouth/Throat: No oropharyngeal exudate.  Nasal congestion, mucosal edema, postnasal drip.  Eyes: Conjunctivae and EOM are normal.  Neck: Normal range of motion. Neck supple.  Cardiovascular: Normal rate, regular rhythm and normal heart sounds.   Pulmonary/Chest: Effort normal and breath sounds normal. No respiratory distress.  Musculoskeletal: Normal range of motion. She exhibits no edema.  Lymphadenopathy:    She has no cervical adenopathy.  Neurological: She is alert and oriented to person, place, and time. No sensory deficit.  Skin: Skin is warm and dry.  Psychiatric: She has a normal mood and affect. Her behavior is normal.  Nursing note and vitals reviewed.   ED Course  Procedures (including critical care time) Labs Review Labs Reviewed - No data to display  Imaging Review No results found.   EKG Interpretation None      MDM   Final diagnoses:  Viral URI with cough   NAD. AFVSS. Lungs clear. Advised continued symptomatic treatment. No indication for CXR at this time. No indication  for antibiotics. Rx flonase, tessalon. Stable for d/c. Return precautions given. Patient states understanding of treatment care plan and is agreeable.  Kathrynn Speed, PA-C 06/01/14 1859  Pricilla Loveless, MD 06/06/14 [phone]

## 2014-09-11 ENCOUNTER — Emergency Department (HOSPITAL_BASED_OUTPATIENT_CLINIC_OR_DEPARTMENT_OTHER)
Admission: EM | Admit: 2014-09-11 | Discharge: 2014-09-11 | Disposition: A | Discharge status: Home / Self Care | Payer: Commercial Indemnity | Attending: Emergency Medicine | Admitting: Emergency Medicine

## 2014-09-11 ENCOUNTER — Encounter (HOSPITAL_BASED_OUTPATIENT_CLINIC_OR_DEPARTMENT_OTHER): Payer: Self-pay | Admitting: Emergency Medicine

## 2014-09-11 DIAGNOSIS — Z87891 Personal history of nicotine dependence: Secondary | ICD-10-CM | POA: Insufficient documentation

## 2014-09-11 DIAGNOSIS — N939 Abnormal uterine and vaginal bleeding, unspecified: Secondary | ICD-10-CM | POA: Diagnosis present

## 2014-09-11 DIAGNOSIS — N938 Other specified abnormal uterine and vaginal bleeding: Secondary | ICD-10-CM | POA: Insufficient documentation

## 2014-09-11 DIAGNOSIS — Z8619 Personal history of other infectious and parasitic diseases: Secondary | ICD-10-CM | POA: Insufficient documentation

## 2014-09-11 DIAGNOSIS — Z3202 Encounter for pregnancy test, result negative: Secondary | ICD-10-CM | POA: Insufficient documentation

## 2014-09-11 DIAGNOSIS — F3130 Bipolar disorder, current episode depressed, mild or moderate severity, unspecified: Secondary | ICD-10-CM | POA: Diagnosis not present

## 2014-09-11 DIAGNOSIS — Z793 Long term (current) use of hormonal contraceptives: Secondary | ICD-10-CM | POA: Insufficient documentation

## 2014-09-11 DIAGNOSIS — Z7951 Long term (current) use of inhaled steroids: Secondary | ICD-10-CM | POA: Insufficient documentation

## 2014-09-11 DIAGNOSIS — Z79899 Other long term (current) drug therapy: Secondary | ICD-10-CM | POA: Insufficient documentation

## 2014-09-11 LAB — PROTIME-INR
INR: 0.99 (ref 0.00–1.49)
Prothrombin Time: 13.3 seconds (ref 11.6–15.2)

## 2014-09-11 LAB — COMPREHENSIVE METABOLIC PANEL
ALBUMIN: 3.7 g/dL (ref 3.5–5.0)
ALK PHOS: 39 U/L (ref 38–126)
ALT: 12 U/L — ABNORMAL LOW (ref 14–54)
ANION GAP: 3 — AB (ref 5–15)
AST: 20 U/L (ref 15–41)
BUN: 11 mg/dL (ref 6–20)
CO2: 25 mmol/L (ref 22–32)
Calcium: 8.3 mg/dL — ABNORMAL LOW (ref 8.9–10.3)
Chloride: 109 mmol/L (ref 101–111)
Creatinine, Ser: 0.85 mg/dL (ref 0.44–1.00)
GFR calc non Af Amer: >60 mL/min (ref >60)
GLUCOSE: 90 mg/dL (ref 65–99)
Potassium: 3.9 mmol/L (ref 3.5–5.1)
Sodium: 137 mmol/L (ref 135–145)
TOTAL PROTEIN: 6.4 g/dL — AB (ref 6.5–8.1)
Total Bilirubin: 0.4 mg/dL (ref 0.3–1.2)

## 2014-09-11 LAB — CBC WITH DIFFERENTIAL/PLATELET
BASOS ABS: 0 10*3/uL (ref 0.0–0.1)
Basophils Relative: 0 % (ref 0–1)
EOS PCT: 1 % (ref 0–5)
Eosinophils Absolute: 0.1 10*3/uL (ref 0.0–0.7)
HEMATOCRIT: 37 % (ref 36.0–46.0)
Hemoglobin: 12.2 g/dL (ref 12.0–15.0)
LYMPHS ABS: 1.8 10*3/uL (ref 0.7–4.0)
Lymphocytes Relative: 25 % (ref 12–46)
MCH: 31.2 pg (ref 26.0–34.0)
MCHC: 33 g/dL (ref 30.0–36.0)
MCV: 94.6 fL (ref 78.0–100.0)
MONO ABS: 0.6 10*3/uL (ref 0.1–1.0)
Monocytes Relative: 9 % (ref 3–12)
Neutro Abs: 4.4 10*3/uL (ref 1.7–7.7)
Neutrophils Relative %: 65 % (ref 43–77)
PLATELETS: 286 10*3/uL (ref 150–400)
RBC: 3.91 MIL/uL (ref 3.87–5.11)
RDW: 13.2 % (ref 11.5–15.5)
WBC: 6.9 10*3/uL (ref 4.0–10.5)

## 2014-09-11 LAB — APTT: APTT: 25 s (ref 24–37)

## 2014-09-11 LAB — PREGNANCY, URINE: PREG TEST UR: NEGATIVE

## 2014-09-11 MED ORDER — ONDANSETRON HCL 4 MG/2ML IJ SOLN
4.0000 mg | Freq: Once | INTRAMUSCULAR | Status: AC
  Administered 2014-09-11: 4 mg via INTRAVENOUS
  Filled 2014-09-11: qty 2

## 2014-09-11 MED ORDER — SODIUM CHLORIDE 0.9 % IV BOLUS (SEPSIS)
1000.0000 mL | Freq: Once | INTRAVENOUS | Status: AC
  Administered 2014-09-11: 1000 mL via INTRAVENOUS

## 2014-09-11 NOTE — ED Provider Notes (Signed)
CSN: [id number]     Arrival date & time 09/11/14  1401 History   First MD Initiated Contact with Patient 09/11/14 1429     Chief Complaint  Patient presents with  . Vaginal Bleeding  . Nausea     (Consider location/radiation/quality/duration/timing/severity/associated sxs/prior Treatment) HPI   Katrina Harper is a 39 y.o. female complaining of heavy menstruation worsening over the course of 2 weeks to a crescendo yesterday. Patient states that she was changing both a tampon and a pad every 45 minutes with passage of large clots. Patient started Miratec progesterone only birth control pills 6 weeks ago. Patient had a normal period approximately 3 weeks ago, she did not bleed for 4 days and then this bleeding started. She was seen by her OB/GYN Dr. Shawnie Pons 4 days ago who performed pelvic exam and reassured the patient that it would take 90 days for the hormones to even out. Patient states that she has a generalized fatigue with lightheadedness that is worsening today. She states that the leading has essentially resolved at this point. She denies syncope, shortness of breath, dyspnea on exertion, palpitations. She reports associated nausea with no emesis. Patient has a 5 out of 10 bilateral lower abdominal cramping which relieved with ibuprofen.  Past Medical History  Diagnosis Date  . Herpes   . Bipolar depression    History reviewed. No pertinent past surgical history. Family History  Problem Relation Age of Onset  . Hypertension Mother   . Cancer Father     melanoma on eye  . Hypertension Father    History  Substance Use Topics  . Smoking status: Former Games developer  . Smokeless tobacco: Never Used  . Alcohol Use: No   OB History    Gravida Para Term Preterm AB TAB SAB Ectopic Multiple Living   Review of Systems  10 systems reviewed and found to be negative, except as noted in the HPI.   Allergies  Lithium; Ortho tri-cyclen; and Lamictal  Home Medications    Prior to Admission medications   Medication Sig Start Date End Date Taking? Authorizing Provider  acyclovir (ZOVIRAX) 200 MG capsule Take 1 capsule (200 mg total) by mouth 3 (three) times daily. 05/10/14   Reva Bores, MD  amphetamine-dextroamphetamine (ADDERALL) 20 MG tablet Take 20 mg by mouth 4 (four) times daily.    Historical Provider, MD  ARIPiprazole (ABILIFY) 30 MG tablet Take 30 mg by mouth daily.    Historical Provider, MD  benzonatate (TESSALON) 100 MG capsule Take 1 capsule (100 mg total) by mouth every 8 (eight) hours. 06/01/14   Robyn M Hess, PA-C  fluticasone (FLONASE) 50 MCG/ACT nasal spray Place 2 sprays into both nostrils daily. 06/01/14   Robyn M Hess, PA-C  norethindrone (MICRONOR,CAMILA,ERRIN) 0.35 MG tablet Take 1 tablet (0.35 mg total) by mouth daily. 04/06/14   Peggy Constant, MD   BP 108/77 mmHg  Pulse 69  Temp(Src) 98.2 F (36.8 C) (Oral)  Resp 18  Ht  (1.676 m)  Wt 144 lb (65.318 kg)  BMI 23.25 kg/m2  SpO2 100%  LMP 08/23/2014 Physical Exam  Constitutional: She is oriented to person, place, and time. She appears well-developed and well-nourished. No distress.  HENT:  Head: Normocephalic and atraumatic.  Mouth/Throat: Oropharynx is clear and moist.  No conjunctival pallor  Eyes: Conjunctivae and EOM are normal. Pupils are equal, round, and reactive to light.  Neck: Normal range  of motion.  Cardiovascular: Normal rate, regular rhythm and intact distal pulses.   Pulmonary/Chest: Effort normal and breath sounds normal.  Abdominal: Soft. Bowel sounds are normal. She exhibits no distension and no mass. There is no tenderness. There is no rebound and no guarding.  Musculoskeletal: Normal range of motion.  Neurological: She is alert and oriented to person, place, and time.  Skin: She is not diaphoretic.  Psychiatric: She has a normal mood and affect.  Nursing note and vitals reviewed.   ED Course  Procedures (including critical care time) Labs Review Labs  Reviewed  COMPREHENSIVE METABOLIC PANEL - Abnormal; Notable for the following:    Calcium 8.3 (*)    Total Protein 6.4 (*)    ALT 12 (*)    Anion gap 3 (*)    All other components within normal limits  PREGNANCY, URINE  CBC WITH DIFFERENTIAL/PLATELET  APTT  PROTIME-INR    Imaging Review No results found.   EKG Interpretation None      MDM   Final diagnoses:  DUB (dysfunctional uterine bleeding)    Filed Vitals:   09/11/14 1410 09/11/14 1620  BP: 103/69 108/77  Pulse: 87 69  Temp: 98.2 F (36.8 C)   TempSrc: Oral   Resp: 18 18  Height: 5\' 6"  (1.676 m)   Weight: 144 lb (65.318 kg)   SpO2: 100% 100%    Medications  ondansetron (ZOFRAN) injection 4 mg (4 mg Intravenous Given 09/11/14 1526)  sodium chloride 0.9 % bolus 1,000 mL (0 mLs Intravenous Stopped 09/11/14 1605)    Katrina Harper is a pleasant 39 y.o. female presenting with very heavy menstrual bleeding has resolved today, she has associated symptoms of nausea and lightheadedness. Patient has recently started a progesterone only birth control pill. Her vital signs are without abnormality, ruddy conjunctiva.  No anemia seen on CBC, workup otherwise normal. Patient is given fluid bolus and as bleeding has resolved, advised to continue with her birth control follow closely with OB/GYN. We've had an extensive discussion of return precautions.   Wynetta Emery, PA-C 09/11/14 1723  Nelva Nay, MD 09/11/14 2056

## 2014-09-11 NOTE — ED Notes (Signed)
Pt in c/o heavy vaginal bleeding x 2.5 weeks. Saw MD who was not concerned, but states bleeding has continued and she had become fatigued, lightheaded, and nauseous.

## 2014-09-11 NOTE — Discharge Instructions (Signed)
Please follow with your primary care doctor in the next 2 days for a check-up. They must obtain records for further management.   Do not hesitate to return to the Emergency Department for any new, worsening or concerning symptoms.    Abnormal Uterine Bleeding Abnormal uterine bleeding can affect women at various stages in life, including teenagers, women in their reproductive years, pregnant women, and women who have reached menopause. Several kinds of uterine bleeding are considered abnormal, including:  Bleeding or spotting between periods.   Bleeding after sexual intercourse.   Bleeding that is heavier or more than normal.   Periods that last longer than usual.  Bleeding after menopause.  Many cases of abnormal uterine bleeding are minor and simple to treat, while others are more serious. Any type of abnormal bleeding should be evaluated by your health care provider. Treatment will depend on the cause of the bleeding. HOME CARE INSTRUCTIONS Monitor your condition for any changes. The following actions may help to alleviate any discomfort you are experiencing:  Avoid the use of tampons and douches as directed by your health care provider.  Change your pads frequently. You should get regular pelvic exams and Pap tests. Keep all follow-up appointments for diagnostic tests as directed by your health care provider.  SEEK MEDICAL CARE IF:   Your bleeding lasts more than 1 week.   You feel dizzy at times.  SEEK IMMEDIATE MEDICAL CARE IF:   You pass out.   You are changing pads every 15 to 30 minutes.   You have abdominal pain.  You have a fever.   You become sweaty or weak.   You are passing large blood clots from the vagina.   You start to feel nauseous and vomit. MAKE SURE YOU:   Understand these instructions.  Will watch your condition.  Will get help right away if you are not doing well or get worse. Document Released: 03/18/2005 Document Revised:  03/23/2013 Document Reviewed: 10/15/2012 [facility] Patient Information 2015 [place], [place]. This information is not intended to replace advice given to you by your health care provider. Make sure you discuss any questions you have with your health care provider.

## 2014-09-11 NOTE — ED Notes (Signed)
Discharge instructions given and reviewed with patient.  Patient verbalized understanding to follow up with GYN.  Patient ambulatory; discharged home in good condition.

## 2014-09-12 ENCOUNTER — Telehealth: Payer: Self-pay | Admitting: *Deleted

## 2014-09-12 NOTE — Telephone Encounter (Signed)
Patient called stating she is light headed dizzy and bleeding enough to change a pad every 30 minutes with blood dripping down her leg.  She states that she is lying on the floor in extreme pain.  She was seen at the Urgent care yesterday and they advised her to follow up with her Gyn but since her symptoms are so severe I have advised patient to proceed to [facility] for evaluation.  She agrees and will call us back to follow up on her visit.

## 2014-09-14 ENCOUNTER — Ambulatory Visit (INDEPENDENT_AMBULATORY_CARE_PROVIDER_SITE_OTHER): Payer: Commercial Indemnity | Admitting: Physician Assistant

## 2014-09-14 ENCOUNTER — Encounter: Payer: Self-pay | Admitting: Physician Assistant

## 2014-09-14 VITALS — BP 136/91 | HR 80 | Ht 66.0 in | Wt 149.8 lb

## 2014-09-14 DIAGNOSIS — Z30430 Encounter for insertion of intrauterine contraceptive device: Secondary | ICD-10-CM

## 2014-09-14 DIAGNOSIS — Z01812 Encounter for preprocedural laboratory examination: Secondary | ICD-10-CM

## 2014-09-14 LAB — POCT URINE PREGNANCY: PREG TEST UR: NEGATIVE

## 2014-09-14 NOTE — Progress Notes (Signed)
Patient ID: Katrina Harper, female   DOB: 05-Aug-1975, 39 y.o.   MRN: [id number] History:  Katrina Harper is a 39 y.o. G2P0020 who presents to clinic today for IUD insertion after 3 weeks of heavy bleeding on Progesterone only OCP.  She states [facility] the heavy bleeding which is now improved. She denies weakness, dizziness, SOB.   She discontinued use of mini-pill 4 days ago.  No unprotected IC x 1 month.  She has previously discussed IUD option with Dr. Jolayne Panther and she is now ready to try as contraception is very important to her - as well as period control.   The following portions of the patient's history were reviewed and updated as appropriate: allergies, current medications, past family history, past medical history, past social history, past surgical history and problem list.  Review of Systems:  Pertinent ROS in HPI.  All other systems are negative.   Objective:  Physical Exam BP 136/91 mmHg  Pulse 80  Ht 5\' 6"  (1.676 m)  Wt 149 lb 12.8 oz (67.949 kg)  BMI 24.19 kg/m2  LMP 08/23/2014 GENERAL: Well-developed, well-nourished female in no acute distress.  HEENT: Normocephalic, atraumatic.  LUNGS: Normal rate. Clear to auscultation bilaterally.  HEART: Regular rate and rhythm with no adventitious sounds.  ABDOMEN: Soft, nontended PELVIC: Normal external female genitalia.  Uterus is normal in size. No adnexal mass or tenderness.  Vagina is pink and rugated.  Normal discharge. Normal cervix contour.  EXTREMITIES: No cyanosis, clubbing, or edema, 2+ distal pulses.   Labs and Imaging Pregnancy Test is negative    GYNECOLOGY CLINIC PROCEDURE NOTE  Katrina Harper is a 39 y.o. G2P0020 here for Mirena IUD insertion.  IUD Insertion Procedure Note Patient identified, informed consent performed.  Discussed risks of irregular bleeding, cramping, infection, malpositioning or misplacement of the IUD outside the uterus which may require  further procedure such as laparoscopy. Time out was performed.  Urine pregnancy test negative.  Speculum placed in the vagina.  Cervix visualized.  Cleaned with Betadine x 2.  Grasped posteriorly with a single tooth tenaculum.  Uterus sounded to 7-8 cm.  Mirena IUD placed per manufacturer's recommendations.  Strings trimmed to 3 cm. Tenaculum was removed, good hemostasis noted.  Patient tolerated procedure well.   Patient was given post-procedure instructions.  She was advised to have backup contraception for one week.  Patient was also asked to check IUD strings periodically and follow up in 4-6 weeks for IUD check.   'Bertram Denver, PA-C 09/14/2014 2:35 PM

## 2014-09-14 NOTE — Patient Instructions (Signed)

## 2014-09-14 NOTE — Progress Notes (Signed)
Patient has been bleeding consistently for about a month now.  It goes from very heavy to light but it more heavy than it is light.  She has been seen at the emergency room.  She feels like it has to do with the birth control that she is taking and is interested in switching to maybe the Copper IUD.  She is concerned because of her medications and her blood pressure.  She is also considering a tubal ligation but she does not have any children at at this point so she is not 100% sure, but she also feels like with her medical conditions it might not be wise to have a child.

## 2014-09-26 ENCOUNTER — Ambulatory Visit: Payer: Commercial Indemnity | Admitting: Obstetrics and Gynecology

## 2014-09-26 DIAGNOSIS — Z30430 Encounter for insertion of intrauterine contraceptive device: Secondary | ICD-10-CM

## 2014-10-18 ENCOUNTER — Ambulatory Visit: Payer: Commercial Indemnity | Admitting: Physician Assistant

## 2014-10-18 DIAGNOSIS — Z09 Encounter for follow-up examination after completed treatment for conditions other than malignant neoplasm: Secondary | ICD-10-CM

## 2014-11-17 ENCOUNTER — Telehealth: Payer: Self-pay | Admitting: Obstetrics and Gynecology

## 2014-11-17 NOTE — Telephone Encounter (Signed)
Returned call, pt states she was dx with eczema 19 yrs ago but had no problems until just recently and is having a flare-up on her face at her eyes.  Currently does not have a dermatologist and was wanting to see if we could call in Elidel cream. Notified Dr Jolayne Panther, who instructed that pt could use Hydrocortisone cream and would need to be seen by dermatologists.  Informed pt, she is unable to get an appt with a dermatologists for weeks, instructed pt to go to Urgent Care, states she will just go to the ER.

## 2014-11-17 NOTE — Telephone Encounter (Signed)
Pt called in stating she needs generic Elidel cream for her eczema. 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 Pt has called several dermatologists and they don't have any openings any time soon. [facility] [address] in  [place]. Please advise.

## 2014-11-29 ENCOUNTER — Emergency Department (HOSPITAL_BASED_OUTPATIENT_CLINIC_OR_DEPARTMENT_OTHER)
Admission: EM | Admit: 2014-11-29 | Discharge: 2014-11-29 | Disposition: A | Discharge status: Home / Self Care | Payer: Managed Care, Other (non HMO) | Attending: Emergency Medicine | Admitting: Emergency Medicine

## 2014-11-29 ENCOUNTER — Encounter (HOSPITAL_BASED_OUTPATIENT_CLINIC_OR_DEPARTMENT_OTHER): Payer: Self-pay | Admitting: *Deleted

## 2014-11-29 DIAGNOSIS — F3130 Bipolar disorder, current episode depressed, mild or moderate severity, unspecified: Secondary | ICD-10-CM | POA: Insufficient documentation

## 2014-11-29 DIAGNOSIS — R21 Rash and other nonspecific skin eruption: Secondary | ICD-10-CM | POA: Insufficient documentation

## 2014-11-29 DIAGNOSIS — Z8619 Personal history of other infectious and parasitic diseases: Secondary | ICD-10-CM | POA: Diagnosis not present

## 2014-11-29 DIAGNOSIS — Z79899 Other long term (current) drug therapy: Secondary | ICD-10-CM | POA: Diagnosis not present

## 2014-11-29 DIAGNOSIS — Z87891 Personal history of nicotine dependence: Secondary | ICD-10-CM | POA: Insufficient documentation

## 2014-11-29 DIAGNOSIS — Z7951 Long term (current) use of inhaled steroids: Secondary | ICD-10-CM | POA: Diagnosis not present

## 2014-11-29 MED ORDER — TRIAMCINOLONE ACETONIDE 0.1 % EX CREA: 1.0000 "application " | TOPICAL_CREAM | Freq: Two times a day (BID) | CUTANEOUS | Status: DC

## 2014-11-29 NOTE — ED Provider Notes (Signed)
CSN: [id number]     Arrival date & time 11/29/14  1326 History   First MD Initiated Contact with Patient 11/29/14 1335     Chief Complaint  Patient presents with  . Rash     (Consider location/radiation/quality/duration/timing/severity/associated sxs/prior Treatment) HPI Comments: Patient presents with complaint of a rash around her eyes. She states that she had this rash years ago and was diagnosed with eczema. She has been on steroid cream for this in the past which usually helps within a few days. 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 She denies any other associated symptoms including fever. No vision change. No new medications, soaps, exposures.  Patient is a 39 y.o. female presenting with rash. The history is provided by the patient.  Rash Associated symptoms: no fever, no myalgias, no nausea, no shortness of breath, not vomiting and not wheezing     Past Medical History  Diagnosis Date  . Herpes   . Bipolar depression    History reviewed. No pertinent past surgical history. Family History  Problem Relation Age of Onset  . Hypertension Mother   . Cancer Father     melanoma on eye  . Hypertension Father    Social History  Substance Use Topics  . Smoking status: Former Games developer  . Smokeless tobacco: Never Used  . Alcohol Use: No   OB History    Gravida Para Term Preterm AB TAB SAB Ectopic Multiple Living   Review of Systems  Constitutional: Negative for fever.  HENT: Negative for facial swelling and trouble swallowing.   Eyes: Negative for photophobia, pain, discharge, redness, itching and visual disturbance.  Respiratory: Negative for shortness of breath, wheezing and stridor.   Cardiovascular: Negative for chest pain.  Gastrointestinal: Negative for nausea and vomiting.  Musculoskeletal: Negative for myalgias.  Skin: Positive for rash.  Neurological: Negative for light-headedness.   Psychiatric/Behavioral: Negative for confusion.      Allergies  Lithium; Ortho tri-cyclen; and Lamictal  Home Medications   Prior to Admission medications   Medication Sig Start Date End Date Taking? Authorizing Provider  acyclovir (ZOVIRAX) 200 MG capsule Take 1 capsule (200 mg total) by mouth 3 (three) times daily. 05/10/14   Reva Bores, MD  amphetamine-dextroamphetamine (ADDERALL) 20 MG tablet Take 20 mg by mouth 4 (four) times daily.    Historical Provider, MD  ARIPiprazole (ABILIFY) 30 MG tablet Take 30 mg by mouth daily.    Historical Provider, MD  benzonatate (TESSALON) 100 MG capsule Take 1 capsule (100 mg total) by mouth every 8 (eight) hours. 06/01/14   Robyn M Hess, PA-C  fluticasone (FLONASE) 50 MCG/ACT nasal spray Place 2 sprays into both nostrils daily. 06/01/14   Robyn M Hess, PA-C  norethindrone (MICRONOR,CAMILA,ERRIN) 0.35 MG tablet Take 1 tablet (0.35 mg total) by mouth daily. 04/06/14   Peggy Constant, MD  triamcinolone cream (KENALOG) 0.1 % Apply 1 application topically 2 (two) times daily. Do not use for longer than 1 week 11/29/14   Renne Crigler, PA-C   BP 125/83 mmHg  Pulse 77  Temp(Src) 97.6 F (36.4 C) (Oral)  Resp 16  Ht  (1.676 m)  Wt 140 lb (63.504 kg)  BMI 22.61 kg/m2  SpO2 100%  LMP 11/29/2014 Physical Exam  Constitutional: She appears well-developed and well-nourished.  HENT:  Head: Normocephalic and atraumatic.  Eyes: Conjunctivae are  normal.  Neck: Normal range of motion. Neck supple.  Pulmonary/Chest: No respiratory distress.  Neurological: She is alert.  Skin: Skin is warm and dry. Rash noted.  Mildly hyperpigmented, irritated skin circumferentially around the eyes. There is no redness or swelling. No drainage. This does not appear to be periorbital cellulitis.  Psychiatric: She has a normal mood and affect.  Nursing note and vitals reviewed.   ED Course  Procedures (including critical care time) Labs Review Labs Reviewed - No data  to display  Imaging Review No results found. I have personally reviewed and evaluated these images and lab results as part of my medical decision-making.   EKG Interpretation None       2:23 PM Patient seen and examined.   Vital signs reviewed and are as follows: BP 125/83 mmHg  Pulse 77  Temp(Src) 97.6 F (36.4 C) (Oral)  Resp 16  Ht 5\' 6"  (1.676 m)  Wt 140 lb (63.504 kg)  BMI 22.61 kg/m2  SpO2 100%  LMP 11/29/2014  Steroid cream prescribed. Discussed at length that this cream is not to be used on the face for longer than 1 week.    Urged follow-up with dermatologist as planned in one month.  MDM   Final diagnoses:  Rash   Medication given for mild rash. No systemic reaction or significant allergic reaction suspected. No infectious etiology suspected.   Renne Crigler, PA-C 11/29/14 1429  Geoffery Lyons, MD 11/29/14 [phone]

## 2014-11-29 NOTE — ED Notes (Signed)
States she needs medication for her eczema.

## 2014-11-29 NOTE — Discharge Instructions (Signed)
Please read and follow all provided instructions.  Your child's diagnoses today include:  1. Rash    Tests performed today include:  Vital signs. See below for results today.   Medications prescribed:   Kenalog cream - medication for skin reaction  Take any prescribed medications only as directed.  Home care instructions:  Follow any educational materials contained in this packet.  Follow-up instructions: Please follow-up with your dermatologist as soon as possible.   Return instructions:   Please return to the Emergency Department if your child experiences worsening symptoms.   Please return if you have any other emergent concerns.  Additional Information:  Your child's vital signs today were: BP 125/83 mmHg   Pulse 77   Temp(Src) 97.6 F (36.4 C) (Oral)   Resp 16   Ht  (1.676 m)   Wt 140 lb (63.504 kg)   BMI 22.61 kg/m2   SpO2 100%   LMP 11/29/2014 If blood pressure (BP) was elevated above 135/85 this visit, please have this repeated by your pediatrician within one month. --------------

## 2014-12-01 ENCOUNTER — Ambulatory Visit: Payer: Commercial Indemnity | Admitting: Obstetrics and Gynecology

## 2014-12-01 DIAGNOSIS — Z30430 Encounter for insertion of intrauterine contraceptive device: Secondary | ICD-10-CM

## 2015-04-17 ENCOUNTER — Other Ambulatory Visit: Payer: Self-pay | Admitting: Obstetrics and Gynecology

## 2016-06-17 ENCOUNTER — Encounter: Payer: Self-pay | Admitting: Emergency Medicine

## 2016-06-17 ENCOUNTER — Emergency Department
Admission: EM | Admit: 2016-06-17 | Discharge: 2016-06-17 | Disposition: A | Discharge status: Home / Self Care | Payer: Commercial Indemnity | Attending: Emergency Medicine | Admitting: Emergency Medicine

## 2016-06-17 DIAGNOSIS — F3161 Bipolar disorder, current episode mixed, mild: Secondary | ICD-10-CM | POA: Insufficient documentation

## 2016-06-17 DIAGNOSIS — F319 Bipolar disorder, unspecified: Secondary | ICD-10-CM

## 2016-06-17 DIAGNOSIS — F3130 Bipolar disorder, current episode depressed, mild or moderate severity, unspecified: Secondary | ICD-10-CM

## 2016-06-17 DIAGNOSIS — Z79899 Other long term (current) drug therapy: Secondary | ICD-10-CM | POA: Insufficient documentation

## 2016-06-17 DIAGNOSIS — F17290 Nicotine dependence, other tobacco product, uncomplicated: Secondary | ICD-10-CM | POA: Insufficient documentation

## 2016-06-17 LAB — COMPREHENSIVE METABOLIC PANEL
ALK PHOS: 47 U/L (ref 38–126)
ALT: 9 U/L — ABNORMAL LOW (ref 14–54)
AST: 20 U/L (ref 15–41)
Albumin: 4 g/dL (ref 3.5–5.0)
Anion gap: 8 (ref 5–15)
BILIRUBIN TOTAL: 0.7 mg/dL (ref 0.3–1.2)
BUN: 10 mg/dL (ref 6–20)
CO2: 23 mmol/L (ref 22–32)
Calcium: 8.8 mg/dL — ABNORMAL LOW (ref 8.9–10.3)
Chloride: 106 mmol/L (ref 101–111)
Creatinine, Ser: 0.84 mg/dL (ref 0.44–1.00)
GFR calc Af Amer: >60 mL/min (ref >60)
GFR calc non Af Amer: >60 mL/min (ref >60)
GLUCOSE: 75 mg/dL (ref 65–99)
Potassium: 3.6 mmol/L (ref 3.5–5.1)
Sodium: 137 mmol/L (ref 135–145)
TOTAL PROTEIN: 7.1 g/dL (ref 6.5–8.1)

## 2016-06-17 LAB — CBC
HEMATOCRIT: 42.9 % (ref 35.0–47.0)
Hemoglobin: 14.8 g/dL (ref 12.0–16.0)
MCH: 31.8 pg (ref 26.0–34.0)
MCHC: 34.6 g/dL (ref 32.0–36.0)
MCV: 91.9 fL (ref 80.0–100.0)
Platelets: 335 10*3/uL (ref 150–440)
RBC: 4.67 MIL/uL (ref 3.80–5.20)
RDW: 13.7 % (ref 11.5–14.5)
WBC: 5.9 10*3/uL (ref 3.6–11.0)

## 2016-06-17 LAB — URINE DRUG SCREEN, QUALITATIVE (ARMC ONLY)
AMPHETAMINES, UR SCREEN: NOT DETECTED
Barbiturates, Ur Screen: NOT DETECTED
Benzodiazepine, Ur Scrn: NOT DETECTED
COCAINE METABOLITE, UR ~~LOC~~: NOT DETECTED
Cannabinoid 50 Ng, Ur ~~LOC~~: POSITIVE — AB
MDMA (Ecstasy)Ur Screen: NOT DETECTED
Methadone Scn, Ur: NOT DETECTED
OPIATE, UR SCREEN: NOT DETECTED
PHENCYCLIDINE (PCP) UR S: NOT DETECTED
Tricyclic, Ur Screen: NOT DETECTED

## 2016-06-17 LAB — ETHANOL: Alcohol, Ethyl (B): <5 mg/dL (ref <5)

## 2016-06-17 LAB — POCT PREGNANCY, URINE: Preg Test, Ur: NEGATIVE

## 2016-06-17 LAB — ACETAMINOPHEN LEVEL: Acetaminophen (Tylenol), Serum: <10 ug/mL — ABNORMAL LOW (ref 10–30)

## 2016-06-17 LAB — SALICYLATE LEVEL: Salicylate Lvl: <7 mg/dL (ref 2.8–30.0)

## 2016-06-17 NOTE — Discharge Instructions (Signed)

## 2016-06-17 NOTE — ED Triage Notes (Signed)
Pt presents with "bipolar symptoms out of control. Pt states she has been taking medication but she feels like it isn't working. She states that she has not been able to see her psychiatrist because she has no insurance, no job, no money. She states that she is "out of options." Pt alert & oriented. NAD noted.

## 2016-06-17 NOTE — ED Provider Notes (Signed)
[facility] Emergency Department Provider Note  ____________________________________________  Time seen: Approximately 12:02 PM  I have reviewed the triage vital signs and the nursing notes.   HISTORY  Chief Complaint mental health evaluation    HPI Lars PinksJenna Ritchey is a 41 y.o. female w/ a hx of bipolar d/o, marijuana use, presenting w/ "bipolar out of control." She reports that she has had a recent change in her medications, and is feeling like "it is not enough." She is limited due to her financial constraints, and is unable to afford seeing her psychiatrist. Over the last several weeks, the patient has been feeling like her bipolar has been out of control although she has difficulty explaining what symptoms she has been experiencing. "I am at a loss for words." She describes feeling like her coping mechanisms are no longer working. She is smoking marijuana and that is not helping. She denies any SI, HI, or hallucinations. "I would never kill myself."   Past Medical History:  Diagnosis Date  . Bipolar depression (HCC)   . Herpes     Patient Active Problem List   Diagnosis Date Noted  . Genital herpes 05/10/2014    History reviewed. No pertinent surgical history.  Current Outpatient Rx  . Order #: [id number] Class: Normal  . Order #: [id number] Class: Historical Med  . Order #: [id number] Class: Historical Med  . Order #: [id number] Class: Print  . Order #: [id number] Class: Print  . Order #: [id number] Class: Normal  . Order #: [id number] Class: Print    Allergies Lithium; Ortho tri-cyclen [norgestimate-eth estradiol]; and Lamictal [lamotrigine]  Family History  Problem Relation Age of Onset  . Hypertension Mother   . Cancer Father     melanoma on eye  . Hypertension Father     Social History Social History  Substance Use Topics  . Smoking status: Current Every Day Smoker    Packs/day: 1.00    Types: Cigars  . Smokeless tobacco: Never Used   Comment: 5 per day  . Alcohol use No    Review of Systems Constitutional: No fever/chills. No lightheadedness or syncope. Eyes: No visual changes. ENT: No sore throat. No congestion or rhinorrhea. Cardiovascular: Denies chest pain. Denies palpitations. Respiratory: Denies shortness of breath.  No cough. Gastrointestinal: No abdominal pain.  No nausea, no vomiting.  No diarrhea.  No constipation. Genitourinary: Negative for dysuria. Musculoskeletal: Negative for back pain. Skin: Negative for rash. Neurological: Negative for headaches. No focal numbness, tingling or weakness.  Psychiatric:Positive bipolar without SI, HI or hallucinations. 10-point ROS otherwise negative.  ____________________________________________   PHYSICAL EXAM:  VITAL SIGNS: ED Triage Vitals  Enc Vitals Group     BP 06/17/16 1127 116/76     Pulse Rate 06/17/16 1127 86     Resp 06/17/16 1127 18     Temp 06/17/16 1127 99.3 F (37.4 C)     Temp Source 06/17/16 1127 Oral     SpO2 06/17/16 1127 99 %     Weight 06/17/16 1127 150 lb (68 kg)     Height 06/17/16 1127 5\' 6"  (1.676 m)     Head Circumference --      Peak Flow --      Pain Score 06/17/16 1128 5     Pain Loc --      Pain Edu? --      Excl. in GC? --     Constitutional: Alert and oriented. Mildly poor hygiene but comfortable appearing. Answers questions appropriately. Eyes: Conjunctivae are normal.  EOMI. No  scleral icterus. Head: Atraumatic. Nose: No congestion/rhinnorhea. Mouth/Throat: Mucous membranes are moist.  Neck: No stridor.  Supple.   Cardiovascular: Normal rate, regular rhythm. No murmurs, rubs or gallops.  Respiratory: Normal respiratory effort.  No accessory muscle use or retractions. Lungs CTAB.  No wheezes, rales or ronchi. Gastrointestinal: Soft, nontender and nondistended.  No guarding or rebound.  No peritoneal signs. Musculoskeletal: No LE edema.  Neurologic:  A&Ox3.  Speech is clear.  Face and smile are symmetric.  EOMI.   Moves all extremities well. Skin:  Skin is warm, dry and intact. No rash noted. Psychiatric: Patient has good insight. No pressured speech. No evidence of psychosis or paranoia. She has a normal affect, and good judgment. He denies SI, HI or hallucinations on my examination.  ____________________________________________   LABS (all labs ordered are listed, but only abnormal results are displayed)  Labs Reviewed  CBC  COMPREHENSIVE METABOLIC PANEL  ETHANOL  SALICYLATE LEVEL  ACETAMINOPHEN LEVEL  URINE DRUG SCREEN, QUALITATIVE ([facility] ONLY)  POCT PREGNANCY, URINE  POC URINE PREG, ED   ____________________________________________  EKG  Not indicated ____________________________________________  RADIOLOGY  No results found.  ____________________________________________   PROCEDURES  Procedure(s) performed: None  Procedures  Critical Care performed: No ____________________________________________   INITIAL IMPRESSION / ASSESSMENT AND PLAN / ED COURSE  Pertinent labs & imaging results that were available during my care of the patient were reviewed by me and considered in my medical decision making (see chart for details).  41 y.o. female with a history of bipolar disorder presenting for worsening bipolar symptoms in the setting of a medication change. Overall, the patient is hemodynamic with stable and has no medical complaints at this time. I'm awaiting the remainder of her labs and if those are reassuring she is medically cleared for psychiatric disposition. She does not meet criteria for involuntary commitment at this time, and will remain an at will patient.  ____________________________________________  FINAL CLINICAL IMPRESSION(S) / ED DIAGNOSES  Final diagnoses:  Bipolar disorder, current episode mixed, mild (HCC)         NEW MEDICATIONS STARTED DURING THIS VISIT:  New Prescriptions   No medications on file      Rockne Menghini, MD 06/17/16  1207

## 2016-06-17 NOTE — ED Notes (Signed)
Belongings labeled with green and white pt labels, taken to [facility].

## 2016-06-17 NOTE — Consult Note (Signed)
[facility] Consult   Reason for Consult:  Consult for 41 year old woman came voluntarily to the emergency room for treatment of mood symptoms Referring Physician:  Karma Greaser Patient Identification: Katrina Harper MRN:  [id number] Principal Diagnosis: Bipolar depression [facility]) Diagnosis:   Patient Active Problem List   Diagnosis Date Noted  . Bipolar depression ([place]) [F31.30] 06/17/2016  . Genital herpes [A60.00] 05/10/2014    Total Time spent with patient: 1 hour  Subjective:   Katrina Harper is a 41 y.o. female patient admitted with "my bipolar is out of control".  HPI:  Patient interviewed. Chart reviewed. 41 year old woman came voluntarily to the emergency room. She says she feels like her bipolar disorder is out of control and is not getting any better. Her mood is chronically bad depressed and at the same time she describes herself as feeling very "detached". Some of her description of her symptoms is a little hard to follow. Denies that she's having any hallucinations. Denies that she has any wish to kill her self or any suicidal thought. Denies any thought of doing anything violent. She sleeps rather poorly. Feels sad and has crying spells frequently during the day. Feels like she has little future and doesn't do very much. She is compliant with the medicine that had been prescribed for her by her psychiatrist although for the last couple weeks she has not been taking her Adderall or Klonopin because they were stolen. She still takes Latuda 120 mg a day. Patient complains that Abilify was helping but is too expensive for her to continue. She says the only medicine that really helped was Lamictal but it gave her a rash. She denies that she is drinking or using any drugs currently.  Medical history: No significant medical problems outside of the psychiatric.  Substance abuse history: Patient denies any substance abuse although her drug screen is positive for  cannabis.  Social history: Patient lives with her father. She says her father is disabled and she rises to take care of them. She talks at length about how much she [employer] and only lives here for the sake of her father. Patient says she has had a few hospitalizations before but the last one was probably around 2008. She denies ever trying to kill her self in the past or being violent. She has a list of medication she has tried in the past that includes the current Latuda and recent Abilify as well as Seroquel and lithium and lamotrigine.  Past Psychiatric History: See note above. Multiple hospitalizations. Multiple medicines. No past history of suicidal behavior.  Risk to Self: Is patient at risk for suicide?: No Risk to Others:   Prior Inpatient Therapy:   Prior Outpatient Therapy:    Past Medical History:  Past Medical History:  Diagnosis Date  . Bipolar depression ([place])   . Herpes    History reviewed. No pertinent surgical history. Family History:  Family History  Problem Relation Age of Onset  . Hypertension Mother   . Cancer Father     melanoma on eye  . Hypertension Father    Family Psychiatric  History: Denies knowing of any family history Social History:  History  Alcohol Use No     History  Drug Use  . Frequency: 1.0 time per week  . Types: Marijuana    Social History   Social History  . Marital status: Single    Spouse name: N/A  . Number of children: N/A  . Years of education: N/A  Social History Main Topics  . Smoking status: Current Every Day Smoker    Packs/day: 1.00    Types: Cigars  . Smokeless tobacco: Never Used     Comment: 5 per day  . Alcohol use No  . Drug use: Yes    Frequency: 1.0 time per week    Types: Marijuana  . Sexual activity: Yes    Partners: Male    Birth control/ protection: Pill   Other Topics Concern  . None   Social History Narrative  . None   Additional Social History:    Allergies:   Allergies   Allergen Reactions  . Lithium     "makes me toxic"  . Ortho Tri-Cyclen [Norgestimate-Eth Estradiol] Other (See Comments)    "aggrivates bipolar"  . Lamictal [Lamotrigine] Rash    Labs:  Results for orders placed or performed during the hospital encounter of 06/17/16 (from the past 48 hour(s))  Comprehensive metabolic panel     Status: Abnormal   Collection Time: 06/17/16 11:32 AM  Result Value Ref Range   Sodium 137 135 - 145 mmol/L   Potassium 3.6 3.5 - 5.1 mmol/L   Chloride 106 101 - 111 mmol/L   CO2 23 22 - 32 mmol/L   Glucose, Bld 75 65 - 99 mg/dL   BUN 10 6 - 20 mg/dL   Creatinine, Ser 0.84 0.44 - 1.00 mg/dL   Calcium 8.8 (L) 8.9 - 10.3 mg/dL   Total Protein 7.1 6.5 - 8.1 g/dL   Albumin 4.0 3.5 - 5.0 g/dL   AST 20 15 - 41 U/L   ALT 9 (L) 14 - 54 U/L   Alkaline Phosphatase 47 38 - 126 U/L   Total Bilirubin 0.7 0.3 - 1.2 mg/dL   GFR calc non Af Amer >60 >60 mL/min   GFR calc Af Amer >60 >60 mL/min    Comment: (NOTE) The eGFR has been calculated using the CKD EPI equation. This calculation has not been validated in all clinical situations. eGFR's persistently <60 mL/min signify possible Chronic Kidney Disease.    Anion gap 8 5 - 15  Ethanol     Status: None   Collection Time: 06/17/16 11:32 AM  Result Value Ref Range   Alcohol, Ethyl (B) <5 <5 mg/dL    Comment:        LOWEST DETECTABLE LIMIT FOR SERUM ALCOHOL IS 5 mg/dL FOR MEDICAL PURPOSES ONLY   Salicylate level     Status: None   Collection Time: 06/17/16 11:32 AM  Result Value Ref Range   Salicylate Lvl <1.6 2.8 - 30.0 mg/dL  Acetaminophen level     Status: Abnormal   Collection Time: 06/17/16 11:32 AM  Result Value Ref Range   Acetaminophen (Tylenol), Serum <10 (L) 10 - 30 ug/mL    Comment:        THERAPEUTIC CONCENTRATIONS VARY SIGNIFICANTLY. A RANGE OF 10-30 ug/mL MAY BE AN EFFECTIVE CONCENTRATION FOR MANY PATIENTS. HOWEVER, SOME ARE BEST TREATED AT CONCENTRATIONS OUTSIDE  THIS RANGE. ACETAMINOPHEN CONCENTRATIONS >150 ug/mL AT 4 HOURS AFTER INGESTION AND >50 ug/mL AT 12 HOURS AFTER INGESTION ARE OFTEN ASSOCIATED WITH TOXIC REACTIONS.   cbc     Status: None   Collection Time: 06/17/16 11:32 AM  Result Value Ref Range   WBC 5.9 3.6 - 11.0 K/uL   RBC 4.67 3.80 - 5.20 MIL/uL   Hemoglobin 14.8 12.0 - 16.0 g/dL   HCT 42.9 35.0 - 47.0 %   MCV 91.9 80.0 - 100.0 fL  MCH 31.8 26.0 - 34.0 pg   MCHC 34.6 32.0 - 36.0 g/dL   RDW 13.7 11.5 - 14.5 %   Platelets 335 150 - 440 K/uL  Urine Drug Screen, Qualitative     Status: Abnormal   Collection Time: 06/17/16 11:32 AM  Result Value Ref Range   Tricyclic, Ur Screen NONE DETECTED NONE DETECTED   Amphetamines, Ur Screen NONE DETECTED NONE DETECTED   MDMA (Ecstasy)Ur Screen NONE DETECTED NONE DETECTED   Cocaine Metabolite,Ur [place] NONE DETECTED NONE DETECTED   Opiate, Ur Screen NONE DETECTED NONE DETECTED   Phencyclidine (PCP) Ur S NONE DETECTED NONE DETECTED   Cannabinoid 50 Ng, Ur [place] POSITIVE (A) NONE DETECTED   Barbiturates, Ur Screen NONE DETECTED NONE DETECTED   Benzodiazepine, Ur Scrn NONE DETECTED NONE DETECTED   Methadone Scn, Ur NONE DETECTED NONE DETECTED    Comment: (NOTE) 353  Tricyclics, urine               Cutoff 1000 ng/mL 200  Amphetamines, urine             Cutoff 1000 ng/mL 300  MDMA (Ecstasy), urine           Cutoff 500 ng/mL 400  Cocaine Metabolite, urine       Cutoff 300 ng/mL 500  Opiate, urine                   Cutoff 300 ng/mL 600  Phencyclidine (PCP), urine      Cutoff 25 ng/mL 700  Cannabinoid, urine              Cutoff 50 ng/mL 800  Barbiturates, urine             Cutoff 200 ng/mL 900  Benzodiazepine, urine           Cutoff 200 ng/mL 1000 Methadone, urine                Cutoff 300 ng/mL 1100 1200 The urine drug screen provides only a preliminary, unconfirmed 1300 analytical test result and should not be used for non-medical 1400 purposes. Clinical consideration and professional  judgment should 1500 be applied to any positive drug screen result due to possible 1600 interfering substances. A more specific alternate chemical method 1700 must be used in order to obtain a confirmed analytical result.  1800 Gas chromato graphy / mass spectrometry (GC/MS) is the preferred 1900 confirmatory method.   Pregnancy, urine POC     Status: None   Collection Time: 06/17/16 11:51 AM  Result Value Ref Range   Preg Test, Ur NEGATIVE NEGATIVE    Comment:        THE SENSITIVITY OF THIS METHODOLOGY IS >24 mIU/mL     No current facility-administered medications for this encounter.    Current Outpatient Prescriptions  Medication Sig Dispense Refill  . acyclovir (ZOVIRAX) 200 MG capsule Take 1 capsule (200 mg total) by mouth 3 (three) times daily. 90 capsule 5  . amphetamine-dextroamphetamine (ADDERALL) 20 MG tablet Take 20 mg by mouth 4 (four) times daily.    . ARIPiprazole (ABILIFY) 30 MG tablet Take 30 mg by mouth daily.    . benzonatate (TESSALON) 100 MG capsule Take 1 capsule (100 mg total) by mouth every 8 (eight) hours. 21 capsule 0  . fluticasone (FLONASE) 50 MCG/ACT nasal spray Place 2 sprays into both nostrils daily. 16 g 0  . JOLIVETTE 0.35 MG tablet TAKE 1 TABLET BY MOUTH EVERY DAY 28 tablet 0  .  triamcinolone cream (KENALOG) 0.1 % Apply 1 application topically 2 (two) times daily. Do not use for longer than 1 week 30 g 0    Musculoskeletal: Strength & Muscle Tone: within normal limits Gait & Station: normal Patient leans: N/A  Psychiatric Specialty Exam: Physical Exam  Nursing note and vitals reviewed. Constitutional: She appears well-developed and well-nourished.  HENT:  Head: Normocephalic and atraumatic.  Eyes: Conjunctivae are normal. Pupils are equal, round, and reactive to light.  Neck: Normal range of motion.  Cardiovascular: Regular rhythm and normal heart sounds.   Respiratory: Effort normal. No respiratory distress.  GI: Soft.  Musculoskeletal:  Normal range of motion.  Neurological: She is alert.  Skin: Skin is warm and dry.  Psychiatric: Judgment normal. Her speech is rapid and/or pressured. She is agitated. She is not combative. Thought content is not paranoid and not delusional. She exhibits a depressed mood. She expresses no homicidal and no suicidal ideation.    Review of Systems  Constitutional: Negative.   HENT: Negative.   Eyes: Negative.   Respiratory: Negative.   Cardiovascular: Negative.   Gastrointestinal: Negative.   Musculoskeletal: Negative.   Skin: Negative.   Neurological: Negative.   Psychiatric/Behavioral: Positive for depression. Negative for hallucinations, memory loss, substance abuse and suicidal ideas. The patient is nervous/anxious. The patient does not have insomnia.     Blood pressure 116/76, pulse 86, temperature 99.3 F (37.4 C), temperature source Oral, resp. rate 18, height _0  (1.676 m), weight 68 kg (150 lb), last menstrual period 06/17/2016, SpO2 99 %.Body mass index is 24.21 kg/m.  General Appearance: Casual  Eye Contact:  Minimal  Speech:  Normal Rate  Volume:  Decreased  Mood:  Depressed  Affect:  Tearful  Thought Process:  Goal Directed  Orientation:  Full (Time, Place, and Person)  Thought Content:  Logical  Suicidal Thoughts:  No  Homicidal Thoughts:  No  Memory:  Immediate;   Good Recent;   Fair Remote;   Fair  Judgement:  Fair  Insight:  Fair  Psychomotor Activity:  Normal  Concentration:  Concentration: Fair  Recall:  [employer] of Knowledge:  Fair  Language:  Fair  Akathisia:  No  Handed:  Right  AIMS (if indicated):     Assets:  Communication Skills Desire for Improvement Housing Physical Health Resilience Social Support  ADL's:  Intact  Cognition:  WNL  Sleep:        Treatment Plan Summary: Plan 41 year old woman who has a history of bipolar disorder. Presents without psychotic symptoms. Has mood and anxiety symptoms. Patient absolutely denied multiple  times any suicidal or homicidal thought. Initially she said that she thought it might benefit her to come into the hospital. After talking with me a few minutes she changed her mind and asked to be discharged. Patient said that at this point she thinks that it would not likely benefit her. She will follow-up with her outpatient psychiatrist. She will be given information about [facility] and local options if she wants to see someone closer. Case reviewed with emergency room physician and TTS.  Disposition: Patient does not meet criteria for psychiatric inpatient admission. Supportive therapy provided about ongoing stressors.  Alethia Berthold, MD 06/17/2016 5:01 PM

## 2016-06-17 NOTE — ED Notes (Signed)
Pt in via triage; pt states, "my bipolar disorder is out of control, I take my medications, I have tried everything I know to try, something has got to give."  Pt reports financial difficulty in regards to mental health care.  Pt denies HI/SI at this time.  Pt A/Ox4, calm, tearful upon assessment.

## 2016-06-17 NOTE — ED Provider Notes (Signed)
Clinical Course as of Jun 18 1635  Mon Jun 17, 2016  1628 Spoke in person by Dr. Toni Amendlapacs.  He offered admission to behavioral but she declined in favor of outpatient. Does not meet IVC nor inpatient criteria.  [CF]    Clinical Course User Index [CF] Loleta Roseory Sigifredo Pignato, MD      Loleta Roseory Jolanta Cabeza, MD 06/17/16 [phone]

## 2016-06-17 NOTE — ED Notes (Signed)
Apple juice was given to patient

## 2016-06-27 ENCOUNTER — Ambulatory Visit: Payer: Self-pay

## 2016-07-02 ENCOUNTER — Ambulatory Visit: Payer: Self-pay | Admitting: Adult Health Nurse Practitioner

## 2016-07-02 VITALS — BP 136/97 | HR 75 | Temp 98.8°F | Wt 169.6 lb

## 2016-07-02 DIAGNOSIS — A6004 Herpesviral vulvovaginitis: Secondary | ICD-10-CM

## 2016-07-02 DIAGNOSIS — Z0000 Encounter for general adult medical examination without abnormal findings: Secondary | ICD-10-CM | POA: Insufficient documentation

## 2016-07-02 MED ORDER — ACYCLOVIR 200 MG PO CAPS: 200.0000 mg | ORAL_CAPSULE | Freq: Three times a day (TID) | ORAL | 5 refills | Status: DC

## 2016-07-02 NOTE — Progress Notes (Signed)
  Patient: Katrina Harper Female    DOB: 1975-05-03   41 y.o.   MRN: [id number] Visit Date: 07/02/2016  Today's Provider: Jacelyn Pi, NP   Chief Complaint  Patient presents with  . Labs Only  . Contraception   Subjective:    HPI  Family hx of MI, CVA, HTN, HLD.    Pt states that her BP has been borderline before but last time when she was at the hospital it was normal.  Denies HA, dizziness, CP.       Allergies  Allergen Reactions  . Lithium     "makes me toxic"  . Ortho Tri-Cyclen [Norgestimate-Eth Estradiol] Other (See Comments)    "aggrivates bipolar"  . Lamictal [Lamotrigine] Rash   Previous Medications   ACYCLOVIR (ZOVIRAX) 200 MG CAPSULE    Take 1 capsule (200 mg total) by mouth 3 (three) times daily.   AMPHETAMINE-DEXTROAMPHETAMINE (ADDERALL) 20 MG TABLET    Take 20 mg by mouth 4 (four) times daily.   ARIPIPRAZOLE (ABILIFY) 30 MG TABLET    Take 30 mg by mouth daily.   BENZONATATE (TESSALON) 100 MG CAPSULE    Take 1 capsule (100 mg total) by mouth every 8 (eight) hours.   FLUTICASONE (FLONASE) 50 MCG/ACT NASAL SPRAY    Place 2 sprays into both nostrils daily.   JOLIVETTE 0.35 MG TABLET    TAKE 1 TABLET BY MOUTH EVERY DAY   TRIAMCINOLONE CREAM (KENALOG) 0.1 %    Apply 1 application topically 2 (two) times daily. Do not use for longer than 1 week    Review of Systems  All other systems reviewed and are negative.   Social History  Substance Use Topics  . Smoking status: Current Every Day Smoker    Packs/day: 1.00    Types: Cigars  . Smokeless tobacco: Never Used     Comment: 5 per day  . Alcohol use No   Objective:   BP (!) 136/97   Pulse 75   Temp 98.8 F (37.1 C)   Wt 169 lb 9.6 oz (76.9 kg)   LMP 06/17/2016 (Exact Date)   BMI 27.37 kg/m   Physical Exam  Constitutional: She is oriented to person, place, and time. She appears well-developed and well-nourished.  HENT:  Head: Normocephalic and atraumatic.  Eyes: Pupils are equal, round, and  reactive to light.  Neck: Normal range of motion. Neck supple.  Cardiovascular: Normal rate and regular rhythm.   Pulmonary/Chest: Effort normal and breath sounds normal.  Abdominal: Soft. Bowel sounds are normal.  Musculoskeletal: Normal range of motion.  Neurological: She is alert and oriented to person, place, and time.  Skin: Skin is warm and dry.  Vitals reviewed.       Assessment & Plan:     Routine labs ordered for 2 weeks.  FU PRN pending labs. Previous BPs reviewed and stable Refill Acyclovir for hx of genital herpes simplex.  Continue to FU with Psychiatry as directed.  Smoking cessation encouraged.         Jacelyn Pi, NP   [facility]

## 2016-07-03 ENCOUNTER — Telehealth: Payer: Self-pay | Admitting: Pharmacy Technician

## 2016-07-03 NOTE — Telephone Encounter (Signed)
Verified that patient's [employer] Coverage ended on 05/02/2015 for ZO#[id number], Group #[id number].  Spoke with Alcario Drought at [place].  Sherilyn Dacosta Care Manager [facility]

## 2016-07-23 ENCOUNTER — Other Ambulatory Visit: Payer: Self-pay

## 2016-07-23 ENCOUNTER — Other Ambulatory Visit: Payer: Self-pay | Admitting: Urology

## 2016-07-23 ENCOUNTER — Ambulatory Visit: Payer: Self-pay | Admitting: Urology

## 2016-07-23 VITALS — BP 108/72 | HR 88 | Temp 96.4°F | Wt 174.0 lb

## 2016-07-23 DIAGNOSIS — Z0000 Encounter for general adult medical examination without abnormal findings: Secondary | ICD-10-CM

## 2016-07-23 NOTE — Addendum Note (Signed)
Addended by: Smiley Houseman D on: 07/23/2016 07:05 PM   Modules accepted: Orders

## 2016-07-23 NOTE — Progress Notes (Signed)
  Patient: Katrina Harper Female    DOB: 1975/05/15   41 y.o.   MRN: [id number] Visit Date: 07/23/2016  Today's Provider: Michiel Cowboy, PA-C   No chief complaint on file.  Subjective:    HPI   Here for labs only.  No complaints.    Allergies  Allergen Reactions  . Lithium     "makes me toxic"  . Ortho Tri-Cyclen [Norgestimate-Eth Estradiol] Other (See Comments)    "aggrivates bipolar"  . Lamictal [Lamotrigine] Rash   Previous Medications   ACYCLOVIR (ZOVIRAX) 200 MG CAPSULE    Take 1 capsule (200 mg total) by mouth 3 (three) times daily.   AMPHETAMINE-DEXTROAMPHETAMINE (ADDERALL) 20 MG TABLET    Take 20 mg by mouth 4 (four) times daily.   ARIPIPRAZOLE (ABILIFY) 30 MG TABLET    Take 30 mg by mouth daily.   CLONAZEPAM (KLONOPIN) 1 MG TABLET    Take 1 mg by mouth 3 (three) times daily as needed for anxiety.    Review of Systems  Social History  Substance Use Topics  . Smoking status: Current Every Day Smoker    Packs/day: 1.00    Types: Cigars  . Smokeless tobacco: Never Used     Comment: 5 per day  . Alcohol use No   Objective:   BP 108/72   Pulse 88   Temp (!) 96.4 F (35.8 C)   Wt 174 lb (78.9 kg)   BMI 28.08 kg/m   Physical Exam      Assessment & Plan:     1. Lab visit only.  F/U pending labs      Michiel Cowboy, PA-C   [facility]

## 2016-07-25 LAB — LIPID PANEL
CHOL/HDL RATIO: 4 ratio (ref 0.0–4.4)
Cholesterol, Total: 155 mg/dL (ref 100–199)
HDL: 39 mg/dL — ABNORMAL LOW (ref >39)
LDL CALC: 62 mg/dL (ref 0–99)
Triglycerides: 269 mg/dL — ABNORMAL HIGH (ref 0–149)
VLDL Cholesterol Cal: 54 mg/dL — ABNORMAL HIGH (ref 5–40)

## 2016-07-25 LAB — HEMOGLOBIN A1C
ESTIMATED AVERAGE GLUCOSE: 88 mg/dL
HEMOGLOBIN A1C: 4.7 % — AB (ref 4.8–5.6)

## 2016-07-25 LAB — PLEASE NOTE

## 2016-07-25 LAB — TSH: TSH: 1.45 u[IU]/mL (ref 0.450–4.500)

## 2016-07-31 ENCOUNTER — Ambulatory Visit: Payer: Commercial Indemnity | Admitting: Pharmacy Technician

## 2016-08-15 ENCOUNTER — Ambulatory Visit: Payer: Self-pay

## 2016-08-17 LAB — COMP. METABOLIC PANEL (12)
A/G RATIO: 1.7 (ref 1.2–2.2)
ALK PHOS: 54 IU/L (ref 39–117)
AST: 13 IU/L (ref 0–40)
Albumin: 3.8 g/dL (ref 3.5–5.5)
BUN / CREAT RATIO: 12 (ref 9–23)
BUN: 11 mg/dL (ref 6–20)
CHLORIDE: 104 mmol/L (ref 96–106)
Calcium: 8.7 mg/dL (ref 8.7–10.2)
Creatinine, Ser: 0.95 mg/dL (ref 0.57–1.00)
GFR calc Af Amer: 88 mL/min/{1.73_m2} (ref >59)
GFR calc non Af Amer: 76 mL/min/{1.73_m2} (ref >59)
Globulin, Total: 2.3 g/dL (ref 1.5–4.5)
Glucose: 104 mg/dL — ABNORMAL HIGH (ref 65–99)
Potassium: 4.3 mmol/L (ref 3.5–5.2)
SODIUM: 141 mmol/L (ref 134–144)
TOTAL PROTEIN: 6.1 g/dL (ref 6.0–8.5)

## 2016-08-17 LAB — CBC WITH DIFFERENTIAL/PLATELET
BASOS: 1 %
Basophils Absolute: 0.1 10*3/uL (ref 0.0–0.2)
EOS (ABSOLUTE): 0.1 10*3/uL (ref 0.0–0.4)
Eos: 1 %
HEMATOCRIT: 40.8 % (ref 34.0–46.6)
Hemoglobin: 13.5 g/dL (ref 11.1–15.9)
IMMATURE GRANS (ABS): 0 10*3/uL (ref 0.0–0.1)
Immature Granulocytes: 0 %
LYMPHS ABS: 2.3 10*3/uL (ref 0.7–3.1)
LYMPHS: 26 %
MCH: 31 pg (ref 26.6–33.0)
MCHC: 33.1 g/dL (ref 31.5–35.7)
MCV: 94 fL (ref 79–97)
MONOS ABS: 0.7 10*3/uL (ref 0.1–0.9)
Monocytes: 8 %
NEUTROS ABS: 5.5 10*3/uL (ref 1.4–7.0)
Neutrophils: 64 %
Platelets: 306 10*3/uL (ref 150–379)
RBC: 4.35 x10E6/uL (ref 3.77–5.28)
RDW: 13.7 % (ref 12.3–15.4)
WBC: 8.6 10*3/uL (ref 3.4–10.8)

## 2016-08-17 LAB — SPECIMEN STATUS REPORT

## 2016-09-11 ENCOUNTER — Ambulatory Visit: Payer: Commercial Indemnity

## 2017-03-18 ENCOUNTER — Telehealth: Payer: Self-pay | Admitting: Licensed Clinical Social Worker

## 2017-03-18 NOTE — Telephone Encounter (Signed)
Patient left a voicemail requesting an appointment to be seen in the primary care clinic. She mentioned that she was experiencing side effects with Acyclovir and requested to be switched to an alternative medication.   She was advised that due to it being so long since her last appointment that she would need to schedule an appointment to be seen by a provider before a change to her medications can be made.

## 2017-04-03 ENCOUNTER — Ambulatory Visit: Payer: Managed Care, Other (non HMO) | Admitting: Adult Health Nurse Practitioner

## 2017-04-03 VITALS — BP 115/80 | HR 72 | Temp 97.2°F | Wt 199.8 lb

## 2017-04-03 DIAGNOSIS — A6000 Herpesviral infection of urogenital system, unspecified: Secondary | ICD-10-CM

## 2017-04-03 DIAGNOSIS — Z0000 Encounter for general adult medical examination without abnormal findings: Secondary | ICD-10-CM

## 2017-04-03 MED ORDER — VALACYCLOVIR HCL 500 MG PO TABS: 500.0000 mg | ORAL_TABLET | Freq: Every day | ORAL | 11 refills | Status: DC

## 2017-04-03 NOTE — Progress Notes (Signed)
   Subjective:    Patient ID: Katrina Harper, female    DOB: Sep 20, 1975, 42 y.o.   MRN: [id number]  HPI   Katrina PinksJenna Krempasky is a 42 yo female here for a med problem. She wants to change Acyclovir to Valtrex. She reports Acyclovir is causing cramps in her hands, feet, and sides. She says this is a new side effect.   Pt reports she has decreased her tobacco use.   Patient Active Problem List   Diagnosis Date Noted  . Healthcare maintenance 07/02/2016  . Bipolar depression ([facility]) 06/17/2016  . Genital herpes 05/10/2014   Allergies as of 04/03/2017      Reactions   Lithium    "makes me toxic"   Ortho Tri-cyclen [norgestimate-eth Estradiol] Other (See Comments)   "aggrivates bipolar"   Lamictal [lamotrigine] Rash      Medication List        Accurate as of 04/03/17  8:03 PM. Always use your most recent med list.          acyclovir 200 MG capsule Commonly known as:  ZOVIRAX Take 1 capsule (200 mg total) by mouth 3 (three) times daily.   ADDERALL 20 MG tablet Generic drug:  amphetamine-dextroamphetamine Take 20 mg by mouth 4 (four) times daily.   ARIPiprazole 30 MG tablet Commonly known as:  ABILIFY Take 30 mg by mouth daily.   clonazePAM 1 MG tablet Commonly known as:  KLONOPIN Take 1 mg by mouth 3 (three) times daily as needed for anxiety.         Review of Systems  All other systems reviewed and are negative.      Objective:   Physical Exam  Constitutional: She is oriented to person, place, and time. She appears well-developed and well-nourished.  Cardiovascular: Normal rate, regular rhythm and normal heart sounds.  Pulmonary/Chest: Effort normal and breath sounds normal.  Abdominal: Soft. Bowel sounds are normal.  Neurological: She is alert and oriented to person, place, and time.  Skin: Skin is warm and dry.    BP 115/80 (BP Location: Left Arm, Patient Position: Sitting, Cuff Size: Normal)   Pulse 72   Temp (!) 97.2 F (36.2 C)   Wt 199 lb 12.8 oz (90.6  kg)   LMP 03/09/2017 (Approximate)   BMI 32.25 kg/m        Assessment & Plan:   Stop Acyclovir and begin Valtrex 500mg , once daily.   F/u in 4 mo w/ labs.

## 2017-05-19 ENCOUNTER — Telehealth: Payer: Self-pay | Admitting: Pharmacy Technician

## 2017-05-19 NOTE — Telephone Encounter (Signed)
Received updated proof of income.  Patient eligible to receive medication assistance at [facility] through 2019, as long as eligibility requirements continue to be met.  [facility]

## 2017-07-08 ENCOUNTER — Other Ambulatory Visit: Payer: Self-pay

## 2017-07-08 MED ORDER — VALACYCLOVIR HCL 500 MG PO TABS: 500.0000 mg | ORAL_TABLET | Freq: Every day | ORAL | 11 refills | Status: DC

## 2017-07-08 NOTE — Progress Notes (Signed)
Pt called for a rf for valtrex. [facility]

## 2017-08-06 ENCOUNTER — Other Ambulatory Visit: Payer: Self-pay

## 2017-08-13 ENCOUNTER — Ambulatory Visit: Payer: Self-pay | Admitting: Internal Medicine

## 2017-10-15 ENCOUNTER — Ambulatory Visit: Payer: Managed Care, Other (non HMO) | Admitting: Internal Medicine

## 2017-10-15 ENCOUNTER — Encounter (INDEPENDENT_AMBULATORY_CARE_PROVIDER_SITE_OTHER): Payer: Self-pay

## 2017-10-15 ENCOUNTER — Encounter: Payer: Self-pay | Admitting: Internal Medicine

## 2017-10-15 VITALS — BP 113/83 | HR 78 | Temp 97.8°F | Ht 66.0 in | Wt 195.1 lb

## 2017-10-15 DIAGNOSIS — Z0000 Encounter for general adult medical examination without abnormal findings: Secondary | ICD-10-CM

## 2017-10-15 NOTE — Progress Notes (Signed)
   Subjective:    Patient ID: Katrina Harper, female    DOB: 21-Nov-1975, 42 y.o.   MRN: [id number]  HPI  Pt presents today with concerns about hair loss, weight gain, and dry skin.   Pt says that she recognized her hair loss 2 weeks ago; states that hair has been coming out when she runs her fingers through her hair.   [facility] in [place] handles pt's medications.    Last labs were 07/23/2016.   Review of Systems Patient Active Problem List   Diagnosis Date Noted  . Healthcare maintenance 07/02/2016  . Bipolar depression ([place]) 06/17/2016  . Genital herpes 05/10/2014   Allergies as of 10/15/2017      Reactions   Lithium    "makes me toxic"   Ortho Tri-cyclen [norgestimate-eth Estradiol] Other (See Comments)   "aggrivates bipolar"   Lamictal [lamotrigine] Rash      Medication List        Accurate as of 10/15/17 10:34 AM. Always use your most recent med list.          ADDERALL 20 MG tablet Generic drug:  amphetamine-dextroamphetamine Take 20 mg by mouth 4 (four) times daily.   ARIPiprazole 30 MG tablet Commonly known as:  ABILIFY Take 30 mg by mouth daily.   clonazePAM 1 MG tablet Commonly known as:  KLONOPIN Take 1 mg by mouth 3 (three) times daily as needed for anxiety.   valACYclovir 500 MG tablet Commonly known as:  VALTREX Take 1 tablet (500 mg total) by mouth daily.          Objective:   Physical Exam  Constitutional: She is oriented to person, place, and time.  Cardiovascular: Normal rate, regular rhythm and normal heart sounds.  Pulmonary/Chest: Effort normal and breath sounds normal.  Neurological: She is alert and oriented to person, place, and time.    BP 113/83 (BP Location: Left Arm, Patient Position: Sitting)   Pulse 78   Temp 97.8 F (36.6 C) (Oral)   Ht '5\' 6"'$  (1.676 m)   Wt 195 lb 1.6 oz (88.5 kg)   LMP 10/01/2017 (Approximate)   BMI 31.49 kg/m      Assessment & Plan:   Labs today: Met C, CBC, TSH, T4, A1C, Lipid.   FU in  1 year with labs a week prior; today's labs will be available for patient to see on MyChart in a week.

## 2017-10-16 LAB — COMPREHENSIVE METABOLIC PANEL
ALK PHOS: 79 IU/L (ref 39–117)
ALT: 16 IU/L (ref 0–32)
AST: 17 IU/L (ref 0–40)
Albumin/Globulin Ratio: 1.5 (ref 1.2–2.2)
Albumin: 4.1 g/dL (ref 3.5–5.5)
BUN/Creatinine Ratio: 8 — ABNORMAL LOW (ref 9–23)
BUN: 6 mg/dL (ref 6–20)
Bilirubin Total: 0.3 mg/dL (ref 0.0–1.2)
CO2: 20 mmol/L (ref 20–29)
CREATININE: 0.79 mg/dL (ref 0.57–1.00)
Calcium: 9.5 mg/dL (ref 8.7–10.2)
Chloride: 104 mmol/L (ref 96–106)
GFR calc non Af Amer: 95 mL/min/{1.73_m2} (ref >59)
GFR, EST AFRICAN AMERICAN: 109 mL/min/{1.73_m2} (ref >59)
GLUCOSE: 75 mg/dL (ref 65–99)
Globulin, Total: 2.7 g/dL (ref 1.5–4.5)
Potassium: 4.9 mmol/L (ref 3.5–5.2)
Sodium: 141 mmol/L (ref 134–144)
TOTAL PROTEIN: 6.8 g/dL (ref 6.0–8.5)

## 2017-10-16 LAB — LIPID PANEL
CHOL/HDL RATIO: 3.9 ratio (ref 0.0–4.4)
Cholesterol, Total: 189 mg/dL (ref 100–199)
HDL: 49 mg/dL (ref >39)
LDL CALC: 116 mg/dL — AB (ref 0–99)
Triglycerides: 119 mg/dL (ref 0–149)
VLDL Cholesterol Cal: 24 mg/dL (ref 5–40)

## 2017-10-16 LAB — CBC
HEMATOCRIT: 42.9 % (ref 34.0–46.6)
Hemoglobin: 14.6 g/dL (ref 11.1–15.9)
MCH: 32.2 pg (ref 26.6–33.0)
MCHC: 34 g/dL (ref 31.5–35.7)
MCV: 95 fL (ref 79–97)
Platelets: 283 10*3/uL (ref 150–450)
RBC: 4.53 x10E6/uL (ref 3.77–5.28)
RDW: 13.7 % (ref 12.3–15.4)
WBC: 6 10*3/uL (ref 3.4–10.8)

## 2017-10-16 LAB — HEMOGLOBIN A1C
Est. average glucose Bld gHb Est-mCnc: 94 mg/dL
Hgb A1c MFr Bld: 4.9 % (ref 4.8–5.6)

## 2017-10-16 LAB — T4: T4, Total: 7.6 ug/dL (ref 4.5–12.0)

## 2017-10-16 LAB — TSH: TSH: 1.25 u[IU]/mL (ref 0.450–4.500)

## 2017-11-06 ENCOUNTER — Ambulatory Visit: Payer: Managed Care, Other (non HMO)

## 2017-11-13 ENCOUNTER — Ambulatory Visit: Payer: Managed Care, Other (non HMO)

## 2017-11-27 ENCOUNTER — Ambulatory Visit: Payer: Managed Care, Other (non HMO) | Admitting: Adult Health Nurse Practitioner

## 2017-11-27 VITALS — BP 101/76 | HR 104 | Temp 98.1°F | Ht 71.0 in | Wt 200.4 lb

## 2017-11-27 DIAGNOSIS — N898 Other specified noninflammatory disorders of vagina: Secondary | ICD-10-CM

## 2017-11-27 MED ORDER — METRONIDAZOLE 500 MG PO TABS: 500.0000 mg | ORAL_TABLET | Freq: Two times a day (BID) | ORAL | 0 refills | Status: DC

## 2017-11-27 NOTE — Progress Notes (Signed)
   Subjective:    Patient ID: Katrina Harper, female    DOB: [birth year], 42 y.o.   MRN: [id number]  HPI  Katrina PinksJenna Timothy is a 42 yo F presents w/ cc of bacterial vaginosis. She denies burning with urination or foul order. She denies recent unprotected sex. Her LMP is today.    Review of Systems  All other systems reviewed and are negative.      Objective:   Physical Exam  Constitutional: She is oriented to person, place, and time. She appears well-developed and well-nourished.  Cardiovascular: Normal rate, regular rhythm and normal heart sounds.  Pulmonary/Chest: Effort normal and breath sounds normal.  Abdominal: Soft. Bowel sounds are normal. There is no tenderness. There is no CVA tenderness.  Neurological: She is alert and oriented to person, place, and time.  Psychiatric: She has a normal mood and affect. Her behavior is normal. Judgment and thought content normal.  Vitals reviewed.   BP 101/76   Pulse (!) 104   Temp 98.1 F (36.7 C)   Ht 5\' 11"  (1.803 m)   Wt 200 lb 6.4 oz (90.9 kg)   BMI 27.95 kg/m        Assessment & Plan:   Vaginal discharge: Urine sample tonight Begin Flagyl 500mg  BID for 1 week  Avoid ETOH while on Flagyl.  If flagyl is ineffective make an appointment at the health department for pap smear.   F/u PRN.

## 2017-11-28 LAB — URINALYSIS, ROUTINE W REFLEX MICROSCOPIC
BILIRUBIN UA: NEGATIVE
Glucose, UA: NEGATIVE
KETONES UA: NEGATIVE
Leukocytes, UA: NEGATIVE
NITRITE UA: NEGATIVE
PH UA: 5.5 (ref 5.0–7.5)
Protein, UA: NEGATIVE
RBC UA: NEGATIVE
SPEC GRAV UA: 1.02 (ref 1.005–1.030)
UUROB: 0.2 mg/dL (ref 0.2–1.0)

## 2017-12-30 ENCOUNTER — Telehealth: Payer: Self-pay | Admitting: Adult Health Nurse Practitioner

## 2017-12-30 NOTE — Telephone Encounter (Signed)
Called back and made appointment for 10/17. Patient complained of bruising on legs.

## 2018-01-15 ENCOUNTER — Ambulatory Visit: Payer: Self-pay | Admitting: Adult Health

## 2018-02-03 ENCOUNTER — Other Ambulatory Visit: Payer: Self-pay

## 2018-02-03 ENCOUNTER — Encounter: Payer: Self-pay | Admitting: Gerontology

## 2018-02-03 ENCOUNTER — Ambulatory Visit: Payer: Managed Care, Other (non HMO) | Admitting: Gerontology

## 2018-02-03 VITALS — BP 113/77 | HR 72 | Temp 97.8°F | Ht 66.0 in | Wt 198.0 lb

## 2018-02-03 DIAGNOSIS — R05 Cough: Secondary | ICD-10-CM

## 2018-02-03 DIAGNOSIS — J3489 Other specified disorders of nose and nasal sinuses: Secondary | ICD-10-CM

## 2018-02-03 DIAGNOSIS — R059 Cough, unspecified: Secondary | ICD-10-CM

## 2018-02-03 MED ORDER — SALINE SPRAY 0.65 % NA SOLN: 1.0000 | NASAL | 0 refills | Status: DC | PRN

## 2018-02-03 MED ORDER — BENZONATATE 100 MG PO CAPS: 100.0000 mg | ORAL_CAPSULE | Freq: Three times a day (TID) | ORAL | 0 refills | Status: DC | PRN

## 2018-02-03 NOTE — Progress Notes (Signed)
Patient: Katrina Harper Female    DOB: September 21, 1975   42 y.o.   MRN: [id number] Visit Date: 02/03/2018  Today's Provider: Rolm Gala, NP   Chief Complaint  Patient presents with  . Follow-up    possible sinus infection   Subjective:    HPI Ms. Doolin 42 y/o presents for follow up on cough, fever and nausea that has being going on for 1 week. She  finished a course of Z pack anti biotics  this morning which was prescribed by Dr Evelene Croon her Psychiatrist on 11/1.Marland Kitchen  She reports still coughing up green tinged sputum, cough wakes her up at night and she takes OTC muccinex. Also reports greenish nasal drainage, sinus congestion. Denies fever, chills, ear ache. She reports taking Wellbutrin for smoking cessation and has cut back to smoking 5 cigars in four days. Otherwise, she reports being in good health.    Allergies  Allergen Reactions  . Lithium     "makes me toxic"  . Ortho Tri-Cyclen [Norgestimate-Eth Estradiol] Other (See Comments)    "aggrivates bipolar"  . Lamictal [Lamotrigine] Rash   Previous Medications   AMPHETAMINE-DEXTROAMPHETAMINE (ADDERALL) 20 MG TABLET    Take 20 mg by mouth 4 (four) times daily.   ARIPIPRAZOLE (ABILIFY) 30 MG TABLET    Take 30 mg by mouth daily.   BUPROPION (WELLBUTRIN XL) 150 MG 24 HR TABLET    Take 150 mg by mouth daily.   CLONAZEPAM (KLONOPIN) 1 MG TABLET    Take 1 mg by mouth 3 (three) times daily as needed for anxiety.   FLUTICASONE (FLONASE) 50 MCG/ACT NASAL SPRAY    Place 1 spray into both nostrils daily.   IBUPROFEN (ADVIL,MOTRIN) 200 MG TABLET    Take 200 mg by mouth every 6 (six) hours as needed.   METRONIDAZOLE (FLAGYL) 500 MG TABLET    Take 1 tablet (500 mg total) by mouth 2 (two) times daily.   VALACYCLOVIR (VALTREX) 500 MG TABLET    Take 1 tablet (500 mg total) by mouth daily.    Review of Systems  Constitutional: Negative.   HENT: Positive for rhinorrhea (greenish drainage), sinus pressure and sore throat.   Eyes: Negative.    Respiratory: Positive for wheezing.   Cardiovascular: Negative.   Gastrointestinal: Negative.   Neurological: Negative.     Social History   Tobacco Use  . Smoking status: Current Every Day Smoker    Types: Cigars  . Smokeless tobacco: Never Used  . Tobacco comment: 3 per day, smokes '3 bowls of cannabis' daily  Substance Use Topics  . Alcohol use: No    Alcohol/week: 0.0 standard drinks   Objective:   BP 113/77 (BP Location: Left Arm, Patient Position: Sitting)   Pulse 72   Temp 97.8 F (36.6 C)   Ht 5\' 6"  (1.676 m)   Wt 198 lb (89.8 kg)   SpO2 97%   BMI 31.96 kg/m   Physical Exam  Constitutional: She is oriented to person, place, and time. She appears well-developed and well-nourished.  HENT:  Head: Normocephalic and atraumatic.  Right Ear: External ear normal.  Left Ear: External ear normal.  Eyes: Pupils are equal, round, and reactive to light. EOM are normal.  Neck: Normal range of motion. Neck supple.  Cardiovascular: Normal rate and regular rhythm.  Pulmonary/Chest: Effort normal. She has no wheezes. She has rhonchi in the right lower field and the left lower field.  Neurological: She is alert and oriented to person, place, and time.  Assessment & Plan:     1. Cough in adult  - benzonatate (TESSALON PERLES) 100 MG capsule; Take 1 capsule (100 mg total) by mouth 3 (three) times daily as needed for cough.  Dispense: 20 capsule; Refill: 0 Follow up with cbc with diff 11/6 to rule out bacterial infection. Adequate hydration and rest. Follow up with flu vaccine when symptoms resolves.   2. Sinus pressure/Congestion  - CBC w/Diff; Future - sodium chloride (OCEAN) 0.65 % SOLN nasal spray; Place 1 spray into both nostrils as needed for congestion.  Dispense: 1 Bottle; Refill: 0       Crystallee Werden Trellis Paganini, NP   [facility]

## 2018-02-04 ENCOUNTER — Other Ambulatory Visit: Payer: Self-pay

## 2018-07-29 ENCOUNTER — Other Ambulatory Visit: Payer: Self-pay | Admitting: Adult Health Nurse Practitioner

## 2018-10-14 ENCOUNTER — Ambulatory Visit: Payer: Self-pay | Admitting: Internal Medicine

## 2018-11-30 ENCOUNTER — Ambulatory Visit: Payer: Self-pay

## 2018-12-08 ENCOUNTER — Telehealth: Payer: Self-pay | Admitting: Pharmacy Technician

## 2018-12-08 NOTE — Telephone Encounter (Signed)
Patient called [facility] and stated that she has prescription drug coverage.  Requested that all her prescriptions be sent to [facility] in [place], [place].  Zoila Shutter to transfer prescriptions.  [facility]

## 2019-01-09 ENCOUNTER — Encounter (HOSPITAL_BASED_OUTPATIENT_CLINIC_OR_DEPARTMENT_OTHER): Payer: Self-pay | Admitting: Emergency Medicine

## 2019-01-09 ENCOUNTER — Other Ambulatory Visit: Payer: Self-pay

## 2019-01-09 ENCOUNTER — Emergency Department (HOSPITAL_BASED_OUTPATIENT_CLINIC_OR_DEPARTMENT_OTHER)
Admission: EM | Admit: 2019-01-09 | Discharge: 2019-01-09 | Disposition: A | Discharge status: Home / Self Care | Payer: Medicare Other | Attending: Emergency Medicine | Admitting: Emergency Medicine

## 2019-01-09 DIAGNOSIS — R4189 Other symptoms and signs involving cognitive functions and awareness: Secondary | ICD-10-CM | POA: Insufficient documentation

## 2019-01-09 DIAGNOSIS — F17210 Nicotine dependence, cigarettes, uncomplicated: Secondary | ICD-10-CM | POA: Insufficient documentation

## 2019-01-09 DIAGNOSIS — R4689 Other symptoms and signs involving appearance and behavior: Secondary | ICD-10-CM

## 2019-01-09 DIAGNOSIS — Z79899 Other long term (current) drug therapy: Secondary | ICD-10-CM | POA: Insufficient documentation

## 2019-01-09 DIAGNOSIS — R531 Weakness: Secondary | ICD-10-CM | POA: Insufficient documentation

## 2019-01-09 LAB — BASIC METABOLIC PANEL
Anion gap: 12 (ref 5–15)
BUN: 8 mg/dL (ref 6–20)
CO2: 25 mmol/L (ref 22–32)
Calcium: 8.9 mg/dL (ref 8.9–10.3)
Chloride: 103 mmol/L (ref 98–111)
Creatinine, Ser: 0.87 mg/dL (ref 0.44–1.00)
GFR calc Af Amer: >60 mL/min (ref >60)
GFR calc non Af Amer: >60 mL/min (ref >60)
Glucose, Bld: 98 mg/dL (ref 70–99)
Potassium: 4.1 mmol/L (ref 3.5–5.1)
Sodium: 140 mmol/L (ref 135–145)

## 2019-01-09 LAB — PREGNANCY, URINE: Preg Test, Ur: NEGATIVE

## 2019-01-09 LAB — URINALYSIS, ROUTINE W REFLEX MICROSCOPIC
Bilirubin Urine: NEGATIVE
Glucose, UA: NEGATIVE mg/dL
Hgb urine dipstick: NEGATIVE
Ketones, ur: NEGATIVE mg/dL
Leukocytes,Ua: NEGATIVE
Nitrite: NEGATIVE
Protein, ur: NEGATIVE mg/dL
Specific Gravity, Urine: 1.02 (ref 1.005–1.030)
pH: 5.5 (ref 5.0–8.0)

## 2019-01-09 LAB — CBC
HCT: 48.3 % — ABNORMAL HIGH (ref 36.0–46.0)
Hemoglobin: 16.1 g/dL — ABNORMAL HIGH (ref 12.0–15.0)
MCH: 32.7 pg (ref 26.0–34.0)
MCHC: 33.3 g/dL (ref 30.0–36.0)
MCV: 98 fL (ref 80.0–100.0)
Platelets: 336 10*3/uL (ref 150–400)
RBC: 4.93 MIL/uL (ref 3.87–5.11)
RDW: 12.3 % (ref 11.5–15.5)
WBC: 7.6 10*3/uL (ref 4.0–10.5)
nRBC: 0 % (ref 0.0–0.2)

## 2019-01-09 NOTE — ED Triage Notes (Signed)
Patient states that she has been feeling " weird" since last Sunday something "neurological" and memory issues. She states " I just cant describe what I am feeling - The patient also reports chills and generalized weakness- she also is very tearful in triage when she is talking about what is going on

## 2019-01-09 NOTE — Discharge Instructions (Signed)
Please follow-up with your psychiatrist as scheduled on Monday.  If your symptoms are not improving, please follow-up with neurology and/or your PCP.  Please return the emergency department if you develop any new or worsening symptoms.

## 2019-01-09 NOTE — ED Provider Notes (Signed)
[place] EMERGENCY DEPARTMENT Provider Note   CSN: [id number] Arrival date & time: 01/09/19  1706     History   Chief Complaint Chief Complaint  Patient presents with  . Weakness    HPI Katrina Harper is a 43 y.o. female with history of bipolar 1 disorder, ADHD who presents for evaluation of feeling weird for the past week and having memory and cognitive issues.  Patient is unable to describe what she is feeling, but states she is having some tingling in her hands intermittently and feeling generally weak.  It is intermittent.  She denies any fever, chest pain, shortness of breath, cough, nausea, vomiting, diarrhea, speech difficulty, SI, HI, AVH.  Patient reports she received an Adderall refill that looked different than normal the day before her symptoms started.  She wonders if this may be causing her to feel strangely.  Her symptoms have been present since this time in when she had an aggressive bipolar outburst.  She denies any headaches, although states she has had a couple ocular migraines this week, which she has had before.  She has a psychiatry appointment scheduled in 2 days.  She states that maybe her symptoms are psychosomatic, however she wanted to make sure.     HPI  Past Medical History:  Diagnosis Date  . ADHD   . Bipolar 1 disorder ([place])   . Bipolar depression ([place])   . Depression   . Herpes     Patient Active Problem List   Diagnosis Date Noted  . Healthcare maintenance 07/02/2016  . Bipolar depression ([place]) 06/17/2016  . Genital herpes 05/10/2014    Past Surgical History:  Procedure Laterality Date  . INDUCED ABORTION       OB History    Gravida  2   Para      Term      Preterm      AB  2   Living        SAB      TAB  2   Ectopic      Multiple      Live Births               Home Medications    Prior to Admission medications   Medication Sig Start Date End Date Taking? Authorizing Provider   amphetamine-dextroamphetamine (ADDERALL) 20 MG tablet Take 20 mg by mouth 4 (four) times daily.    [provider]  ARIPiprazole (ABILIFY) 30 MG tablet Take 30 mg by mouth daily.    [provider]  benzonatate (TESSALON PERLES) 100 MG capsule Take 1 capsule (100 mg total) by mouth 3 (three) times daily as needed for cough. 02/03/18   Iloabachie, Chioma E, NP  buPROPion (WELLBUTRIN XL) 150 MG 24 hr tablet Take 150 mg by mouth daily.    [provider]  clonazePAM (KLONOPIN) 1 MG tablet Take 1 mg by mouth 3 (three) times daily as needed for anxiety.    [provider]  fluticasone (FLONASE) 50 MCG/ACT nasal spray Place 1 spray into both nostrils daily.    [provider]  ibuprofen (ADVIL,MOTRIN) 200 MG tablet Take 200 mg by mouth every 6 (six) hours as needed.    [provider]  metroNIDAZOLE (FLAGYL) 500 MG tablet Take 1 tablet (500 mg total) by mouth 2 (two) times daily. Patient not taking: Reported on 02/03/2018 11/27/17   Doles-Johnson, Teah, NP  sodium chloride (OCEAN) 0.65 % SOLN nasal spray Place 1 spray into  both nostrils as needed for congestion. 02/03/18   Iloabachie, Chioma E, NP  valACYclovir (VALTREX) 500 MG tablet TAKE ONE TABLET BY MOUTH EVERY DAY 07/30/18   Doles-Johnson, Teah, NP    Family History Family History  Problem Relation Age of Onset  . Hypertension Mother   . Alcohol abuse Mother   . Migraines Mother   . Cancer Father        melanoma on eye  . Hypertension Father   . Alcohol abuse Father   . Arthritis Maternal Grandmother   . Mental illness Maternal Grandmother   . Alcohol abuse Maternal Grandfather   . Migraines Sister   . Hypertension Paternal Grandfather     Social History Social History   Tobacco Use  . Smoking status: Current Every Day Smoker    Types: Cigars  . Smokeless tobacco: Never Used  . Tobacco comment: 3 per day, smokes '3 bowls of cannabis' daily  Substance Use Topics  . Alcohol use: No     Alcohol/week: 0.0 standard drinks  . Drug use: Yes    Frequency: 1.0 times per week    Types: Marijuana     Allergies   Lithium, Ortho tri-cyclen [norgestimate-eth estradiol], Symbyax [olanzapine-fluoxetine hcl], and Lamictal [lamotrigine]   Review of Systems Review of Systems  Constitutional: Negative for chills and fever.  HENT: Negative for facial swelling and sore throat.   Respiratory: Negative for shortness of breath.   Cardiovascular: Negative for chest pain.  Gastrointestinal: Negative for abdominal pain, nausea and vomiting.  Genitourinary: Negative for dysuria.  Musculoskeletal: Negative for back pain.  Skin: Negative for rash and wound.  Neurological: Positive for weakness. Negative for light-headedness, numbness and headaches.  Psychiatric/Behavioral: Positive for sleep disturbance (sleeping more than usual). Negative for hallucinations and suicidal ideas. The patient is not nervous/anxious.      Physical Exam Updated Vital Signs BP 122/83 (BP Location: Right Arm)   Pulse (!) 53   Temp 98.5 F (36.9 C) (Oral)   Resp 20   Ht 5\' 6"  (1.676 m)   Wt 83.5 kg   LMP 01/03/2019   SpO2 100%   BMI 29.70 kg/m   Physical Exam Vitals signs and nursing note reviewed.  Constitutional:      General: She is not in acute distress.    Appearance: She is well-developed. She is not diaphoretic.  HENT:     Head: Normocephalic and atraumatic.     Mouth/Throat:     Pharynx: No oropharyngeal exudate.  Eyes:     General: No scleral icterus.       Right eye: No discharge.        Left eye: No discharge.     Extraocular Movements: Extraocular movements intact.     Conjunctiva/sclera: Conjunctivae normal.     Pupils: Pupils are equal, round, and reactive to light.  Neck:     Musculoskeletal: Normal range of motion and neck supple.     Thyroid: No thyromegaly.  Cardiovascular:     Rate and Rhythm: Normal rate and regular rhythm.     Heart sounds: Normal heart sounds. No  murmur. No friction rub. No gallop.   Pulmonary:     Effort: Pulmonary effort is normal. No respiratory distress.     Breath sounds: Normal breath sounds. No stridor. No wheezing or rales.  Abdominal:     General: Bowel sounds are normal. There is no distension.     Palpations: Abdomen is soft.     Tenderness: There is no  abdominal tenderness. There is no guarding or rebound.  Lymphadenopathy:     Cervical: No cervical adenopathy.  Skin:    General: Skin is warm and dry.     Coloration: Skin is not pale.     Findings: No rash.  Neurological:     Mental Status: She is alert.     Coordination: Coordination normal.     Comments: CN 3-12 intact; normal sensation throughout; 5/5 strength in all 4 extremities; equal bilateral grip strength; no ataxia on finger-to-nose, negative Romberg; symmetrical smile, no facial droop      ED Treatments / Results  Labs (all labs ordered are listed, but only abnormal results are displayed) Labs Reviewed  CBC - Abnormal; Notable for the following components:      Result Value   Hemoglobin 16.1 (*)    HCT 48.3 (*)    All other components within normal limits  URINALYSIS, ROUTINE W REFLEX MICROSCOPIC - Abnormal; Notable for the following components:   APPearance CLOUDY (*)    All other components within normal limits  PREGNANCY, URINE  BASIC METABOLIC PANEL    EKG None  Radiology No results found.  Procedures Procedures (including critical care time)  Medications Ordered in ED Medications - No data to display   Initial Impression / Assessment and Plan / ED Course  I have reviewed the triage vital signs and the nursing notes.  Pertinent labs & imaging results that were available during my care of the patient were reviewed by me and considered in my medical decision making (see chart for details).        Patient presenting with indescribable compilation of symptoms, but feeling "weird."  She reports in her brain she feels weak, but  physically does not.  Labs are unremarkable.  UA and U preg negative.  Question whether the change in type of Adderall she was taking may be making her feel differently.  She states she will not take it the rest of the week, which I feel is reasonable. Normal neuro exam without focal deficits. CT head offered, however patient declines and states she feels better knowing her neuro exam is normal and after talking to me.  She has an appointment with her psychiatrist in 2 days.  We will also give follow-up to neurology.  Patient denies any SI, HI, AVH.  She denies even feeling sad/depressed.  Return precautions discussed, including focal weakness, complete numbness, or any other concerning symptoms.  Patient understands and agrees with plan.  Patient vitals stable throughout ED course and discharged in satisfactory condition peer  Final Clinical Impressions(s) / ED Diagnoses   Final diagnoses:  Weakness  Cognitive and behavioral changes    ED Discharge Orders    None       Emi Holes, PA-C 01/09/19 2100    Virgina Norfolk, DO 01/09/19 2239

## 2019-01-12 NOTE — Progress Notes (Signed)
[facility]    Provider:  Dr Lucia GaskinsAhern Requesting Provider: Emergency room Primary Care Provider:  Dr. Milagros Evenerupinder Kaur  CC:  weakness, cognitive, behavior changes; lasting 10 days.  HPI:  Lars PinksJenna Harper is a 43 y.o. female here as requested by the emergency room for weakness, cognitive and behavioral changes in the setting of a manic episode.  Past medical history of bipolar 1 disorder, ADHD.  She presented to the emergency room because of "feeling weird" for the past week and having memory and cognitive issues. She has Bipolar disorder, nothig traumatic happened but had an episode of mania 10 days ago and started Saturday and was "aggressive"  Like nothing she ever felt. No inciting events, she is usually very stable unless there is an event and nothing happened. She was rapid cycling and she was freaked out. She is on Abilify. She woke up with muscle weakness the next day, she went to bed normal and woke up on Sunday 10/3 with severe weakness, arms, legs, spine, she had muscle tightness and also felt weak, her legs did not give out but she felt weak all over nothing focal. Her mind was racing and she was having cognitive episodes. Her body felt slow. Covid was negative. 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 The episode resolved and so has her symptoms. No other focal neurologic deficits, associated symptoms, inciting events or modifiable factors.  Reviewed notes, labs and imaging from outside physicians, which showed:  I reviewed emergency room notes.  Patient says for the last week she has been having memory and cognitive issues and "feeling weird".  Unable to describe what she is feeling.  Some tingling in her hands intermittently and generally feeling weak.  No fever, chest pain, shortness of  breath, cough, nausea, vomiting, diarrhea or speech difficulty.  She is on Adderall.  Symptoms have been present since the time when she had an aggressive bipolar outburst.  She denied any headaches.  She follows with psychiatry and states maybe her symptoms are psychosomatic.  Labs were unremarkable, UA and urine pregnancy negative.  Normal neuro exam, no focal deficits.  Patient declined CT of the head. BMP normal.   Review of Systems: Patient complains of symptoms per HPI as well as the following symptoms: bipolar, add, heart racing. Pertinent negatives and positives per HPI. All others negative.   Social History   Socioeconomic History   Marital status: Single    Spouse name: Not on file   Number of children: 0   Years of education: Not on file   Highest education level: Bachelor's degree (e.g., BA, AB, BS)  Occupational History   Not on file  Social Needs   Financial resource strain: Not very hard   Food insecurity    Worry: Sometimes true    Inability: Sometimes true   Transportation needs    Medical: Yes    Non-medical: Yes  Tobacco Use   Smoking status: Current Every Day Smoker    Types: Cigars   Smokeless tobacco: Never Used   Tobacco comment:  smokes '3 bowls of cannabis' daily  Substance and Sexual Activity   Alcohol use: No    Alcohol/week: 0.0 standard drinks   Drug use: Yes    Frequency: 1.0 times per week    Types: Marijuana  Comment: 3 bowls of cannibis daily   Sexual activity: Yes    Partners: Male    Birth control/protection: None  Lifestyle   Physical activity    Days per week: Patient refused    Minutes per session: Patient refused   Stress: Patient refused  Relationships   Social connections    Talks on phone: Patient refused    Gets together: Patient refused    Attends religious service: Patient refused    Active member of club or organization: Patient refused    Attends meetings of clubs or organizations: Patient refused     Relationship status: Patient refused   Intimate partner violence    Fear of current or ex partner: Patient refused    Emotionally abused: Patient refused    Physically abused: Patient refused    Forced sexual activity: Patient refused  Other Topics Concern   Not on file  Social History Narrative   Pt did not want to answer questions       - said she was looking into getting a car but that's all          Update 01/13/2019   Lives at home alone   Right handed    Family History  Problem Relation Age of Onset   Hypertension Mother    Alcohol abuse Mother    Migraines Mother    Other Mother        cognitive issues started recently   Cancer Father        melanoma on eye   Hypertension Father    Alcohol abuse Father    Arthritis Maternal Grandmother    Mental illness Maternal Grandmother    Alcohol abuse Maternal Grandfather    Migraines Sister    Hypertension Paternal Grandfather     Past Medical History:  Diagnosis Date   ADHD    Bipolar 1 disorder ([place])    Bipolar depression ([place])    Depression    Herpes     Patient Active Problem List   Diagnosis Date Noted   Healthcare maintenance 07/02/2016   Bipolar depression ([place]) 06/17/2016   Genital herpes 05/10/2014    Past Surgical History:  Procedure Laterality Date   INDUCED ABORTION      Current Outpatient Medications  Medication Sig Dispense Refill   amphetamine-dextroamphetamine (ADDERALL) 20 MG tablet Take 20 mg by mouth 4 (four) times daily.     ARIPiprazole (ABILIFY) 30 MG tablet Take 30 mg by mouth daily.     clonazePAM (KLONOPIN) 1 MG tablet Take 1 mg by mouth 3 (three) times daily as needed for anxiety.     fluticasone (FLONASE) 50 MCG/ACT nasal spray Place 1 spray into both nostrils daily.     ibuprofen (ADVIL,MOTRIN) 200 MG tablet Take 200 mg by mouth every 6 (six) hours as needed.     valACYclovir (VALTREX) 500 MG tablet TAKE ONE TABLET BY MOUTH EVERY DAY 30 tablet 0    buPROPion (WELLBUTRIN XL) 150 MG 24 hr tablet Take 150 mg by mouth daily.     metroNIDAZOLE (FLAGYL) 500 MG tablet Take 1 tablet (500 mg total) by mouth 2 (two) times daily. (Patient not taking: Reported on 02/03/2018) 14 tablet 0   sodium chloride (OCEAN) 0.65 % SOLN nasal spray Place 1 spray into both nostrils as needed for congestion. (Patient not taking: Reported on 01/13/2019) 1 Bottle 0   No current facility-administered medications for this visit.     Allergies as of 01/13/2019 - Review Complete 01/13/2019  Allergen Reaction Noted   Lithium  12/07/2012   Ortho tri-cyclen [norgestimate-eth estradiol] Other (See Comments) 12/07/2012   Symbyax [olanzapine-fluoxetine hcl] Other (See Comments) 01/13/2014   Lamictal [lamotrigine] Rash 12/07/2012    Vitals: BP 133/88 (BP Location: Right Arm, Patient Position: Sitting)    Pulse 94    Temp 98 F (36.7 C) Comment: taken at front door   Ht 5' 6.5" (1.689 m)    Wt 190 lb (86.2 kg)    LMP 01/03/2019    BMI 30.21 kg/m  Last Weight:  Wt Readings from Last 1 Encounters:  01/13/19 190 lb (86.2 kg)   Last Height:   Ht Readings from Last 1 Encounters:  01/13/19 5' 6.5" (1.689 m)     Physical exam: Exam: Gen: NAD, conversant, well nourised, obese, well groomed                     CV: tachycardic, no MRG. No Carotid Bruits. No peripheral edema, warm, nontender Eyes: Conjunctivae clear without exudates or hemorrhage  Neuro: Detailed Neurologic Exam  Speech:    Speech is normal; fluent and spontaneous with normal comprehension.  Cognition:    The patient is oriented to person, place, and time;     recent and remote memory intact;     language fluent;     normal attention, concentration,     fund of knowledge Cranial Nerves:    Left pupil 31mm and reactive, right 26mm and sluggish The fundi are normal and spontaneous venous pulsations are present. Visual fields are full to finger confrontation. Extraocular movements are intact.  Trigeminal sensation is intact and the muscles of mastication are normal. The face is symmetric. The palate elevates in the midline. Hearing intact. Voice is normal. Shoulder shrug is normal. The tongue has normal motion without fasciculations.   Coordination:    Normal finger to nose and heel to shin. Normal rapid alternating movements.   Gait:    Heel-toe and tandem gait are normal.   Motor Observation:    No asymmetry, no atrophy, and no involuntary movements noted. Tone:    Normal muscle tone.    Posture:    Posture is normal. normal erect    Strength:    Strength is V/V in the upper and lower limbs.      Sensation: intact to LT     Reflex Exam:  DTR's:    Deep tendon reflexes in the upper and lower extremities are normal bilaterally.   Toes:    The toes are downgoing bilaterally.   Clonus:    Clonus is absent.    Assessment/Plan:  Lovely 41 ear old with episode of cognitive changes, encephalopathy, weakness, sensory change sin one limb need thorough evaluation including MRI brain for stroke, demyelination (MS), vasculitis or other etiologies for global impairment. Her exam is normal today which is reassuring. She also had severe episode of mania may be secondary however needs thorough evaluation.  Orders Placed This Encounter  Procedures   MR BRAIN W WO CONTRAST   B12 and Folate Panel   Methylmalonic acid, serum   TSH   Heavy metals, blood   Vitamin B6   Vitamin B1     Cc: Dr. Risa Grill, MD  [facility] [address] [place], [place] [address]  Phone [phone] Fax [phone]

## 2019-01-13 ENCOUNTER — Other Ambulatory Visit: Payer: Self-pay

## 2019-01-13 ENCOUNTER — Ambulatory Visit (INDEPENDENT_AMBULATORY_CARE_PROVIDER_SITE_OTHER): Payer: Medicare Other | Admitting: Neurology

## 2019-01-13 ENCOUNTER — Encounter: Payer: Self-pay | Admitting: *Deleted

## 2019-01-13 ENCOUNTER — Encounter: Payer: Self-pay | Admitting: Neurology

## 2019-01-13 ENCOUNTER — Telehealth: Payer: Self-pay | Admitting: Neurology

## 2019-01-13 VITALS — BP 133/88 | HR 94 | Temp 98.0°F | Ht 66.5 in | Wt 190.0 lb

## 2019-01-13 DIAGNOSIS — F309 Manic episode, unspecified: Secondary | ICD-10-CM

## 2019-01-13 DIAGNOSIS — R404 Transient alteration of awareness: Secondary | ICD-10-CM | POA: Diagnosis not present

## 2019-01-13 DIAGNOSIS — E531 Pyridoxine deficiency: Secondary | ICD-10-CM

## 2019-01-13 DIAGNOSIS — R202 Paresthesia of skin: Secondary | ICD-10-CM

## 2019-01-13 DIAGNOSIS — R4189 Other symptoms and signs involving cognitive functions and awareness: Secondary | ICD-10-CM

## 2019-01-13 DIAGNOSIS — R531 Weakness: Secondary | ICD-10-CM

## 2019-01-13 DIAGNOSIS — E538 Deficiency of other specified B group vitamins: Secondary | ICD-10-CM | POA: Diagnosis not present

## 2019-01-13 DIAGNOSIS — H539 Unspecified visual disturbance: Secondary | ICD-10-CM

## 2019-01-13 DIAGNOSIS — G9340 Encephalopathy, unspecified: Secondary | ICD-10-CM | POA: Diagnosis not present

## 2019-01-13 DIAGNOSIS — E519 Thiamine deficiency, unspecified: Secondary | ICD-10-CM

## 2019-01-13 NOTE — Telephone Encounter (Signed)
Medicare/medicaid order sent to [facility]. No auth they will reach out to the patient to schedule.  °

## 2019-01-13 NOTE — Addendum Note (Signed)
Addended by: Sarina Ill B on: 01/13/2019 11:57 AM   Modules accepted: Orders

## 2019-01-13 NOTE — Patient Instructions (Signed)
MRI brain Lab work

## 2019-01-20 LAB — COMPREHENSIVE METABOLIC PANEL
ALT: 10 IU/L (ref 0–32)
AST: 15 IU/L (ref 0–40)
Albumin/Globulin Ratio: 1.7 (ref 1.2–2.2)
Albumin: 4.8 g/dL (ref 3.8–4.8)
Alkaline Phosphatase: 77 IU/L (ref 39–117)
BUN/Creatinine Ratio: 9 (ref 9–23)
BUN: 8 mg/dL (ref 6–24)
Bilirubin Total: 0.4 mg/dL (ref 0.0–1.2)
CO2: 22 mmol/L (ref 20–29)
Calcium: 9.9 mg/dL (ref 8.7–10.2)
Chloride: 105 mmol/L (ref 96–106)
Creatinine, Ser: 0.88 mg/dL (ref 0.57–1.00)
GFR calc Af Amer: 95 mL/min/{1.73_m2} (ref >59)
GFR calc non Af Amer: 82 mL/min/{1.73_m2} (ref >59)
Globulin, Total: 2.8 g/dL (ref 1.5–4.5)
Glucose: 112 mg/dL — ABNORMAL HIGH (ref 65–99)
Potassium: 4.9 mmol/L (ref 3.5–5.2)
Sodium: 143 mmol/L (ref 134–144)
Total Protein: 7.6 g/dL (ref 6.0–8.5)

## 2019-01-20 LAB — VITAMIN B6: Vitamin B6: 4.3 ug/L (ref 2.0–32.8)

## 2019-01-20 LAB — CBC WITH DIFFERENTIAL/PLATELET
Basophils Absolute: 0 10*3/uL (ref 0.0–0.2)
Basos: 0 %
EOS (ABSOLUTE): 0 10*3/uL (ref 0.0–0.4)
Eos: 0 %
Hematocrit: 41.4 % (ref 34.0–46.6)
Hemoglobin: 14.4 g/dL (ref 11.1–15.9)
Immature Grans (Abs): 0 10*3/uL (ref 0.0–0.1)
Immature Granulocytes: 0 %
Lymphocytes Absolute: 1.9 10*3/uL (ref 0.7–3.1)
Lymphs: 20 %
MCH: 33.2 pg — ABNORMAL HIGH (ref 26.6–33.0)
MCHC: 34.8 g/dL (ref 31.5–35.7)
MCV: 95 fL (ref 79–97)
Monocytes Absolute: 0.6 10*3/uL (ref 0.1–0.9)
Monocytes: 7 %
Neutrophils Absolute: 6.8 10*3/uL (ref 1.4–7.0)
Neutrophils: 73 %
Platelets: 316 10*3/uL (ref 150–450)
RBC: 4.34 x10E6/uL (ref 3.77–5.28)
RDW: 12 % (ref 11.7–15.4)
WBC: 9.3 10*3/uL (ref 3.4–10.8)

## 2019-01-20 LAB — B12 AND FOLATE PANEL
Folate: 13.9 ng/mL (ref >3.0)
Vitamin B-12: 362 pg/mL (ref 232–1245)

## 2019-01-20 LAB — HEAVY METALS, BLOOD
Arsenic: 6 ug/L (ref 2–23)
Lead, Blood: <1 ug/dL (ref 0–4)
Mercury: <1 ug/L (ref 0.0–14.9)

## 2019-01-20 LAB — METHYLMALONIC ACID, SERUM: Methylmalonic Acid: 313 nmol/L (ref 0–378)

## 2019-01-20 LAB — THYROID PANEL WITH TSH
Free Thyroxine Index: 2.4 (ref 1.2–4.9)
T3 Uptake Ratio: 24 % (ref 24–39)
T4, Total: 9.8 ug/dL (ref 4.5–12.0)
TSH: 4.22 u[IU]/mL (ref 0.450–4.500)

## 2019-01-20 LAB — VITAMIN B1: Thiamine: 119.2 nmol/L (ref 66.5–200.0)

## 2019-01-21 ENCOUNTER — Telehealth: Payer: Self-pay | Admitting: Neurology

## 2019-01-21 NOTE — Telephone Encounter (Signed)
I spoke with the pt and let her know the contrast for MRI brain would be IV contrast not oral. She verbalized appreciation for the call.

## 2019-01-21 NOTE — Telephone Encounter (Signed)
Pt initially stated that in speaking with her mother about her upcoming MRI she was told by her mother which is a nurse that there would be some type of contrast she would have to drink for the MRI.  Phone rep asked again who was it that advised her of that and she stated Dr Jaynee Eagles told her there would be a contrast to take.  Pt is asking for a call to discuss

## 2019-02-02 ENCOUNTER — Other Ambulatory Visit: Payer: Self-pay

## 2019-02-02 ENCOUNTER — Ambulatory Visit
Admission: RE | Admit: 2019-02-02 | Discharge: 2019-02-02 | Disposition: A | Discharge status: Home / Self Care | Payer: Medicare Other | Source: Ambulatory Visit | Attending: Neurology | Admitting: Neurology

## 2019-02-02 DIAGNOSIS — R404 Transient alteration of awareness: Secondary | ICD-10-CM

## 2019-02-02 DIAGNOSIS — R4189 Other symptoms and signs involving cognitive functions and awareness: Secondary | ICD-10-CM

## 2019-02-02 DIAGNOSIS — G9340 Encephalopathy, unspecified: Secondary | ICD-10-CM

## 2019-02-02 DIAGNOSIS — E538 Deficiency of other specified B group vitamins: Secondary | ICD-10-CM

## 2019-02-02 DIAGNOSIS — E519 Thiamine deficiency, unspecified: Secondary | ICD-10-CM

## 2019-02-02 DIAGNOSIS — R202 Paresthesia of skin: Secondary | ICD-10-CM

## 2019-02-02 DIAGNOSIS — H539 Unspecified visual disturbance: Secondary | ICD-10-CM

## 2019-02-02 DIAGNOSIS — E531 Pyridoxine deficiency: Secondary | ICD-10-CM

## 2019-02-02 DIAGNOSIS — F309 Manic episode, unspecified: Secondary | ICD-10-CM

## 2019-02-02 DIAGNOSIS — R531 Weakness: Secondary | ICD-10-CM

## 2019-02-02 MED ORDER — GADOBENATE DIMEGLUMINE 529 MG/ML IV SOLN
18.0000 mL | Freq: Once | INTRAVENOUS | Status: AC | PRN
  Administered 2019-02-02: 18 mL via INTRAVENOUS

## 2019-02-05 ENCOUNTER — Telehealth: Payer: Self-pay | Admitting: Neurology

## 2019-02-05 NOTE — Telephone Encounter (Signed)
Pt is asking for a call to discuss the results to her MRI.  Pt states the results show on Mychart, please call

## 2019-02-08 NOTE — Telephone Encounter (Signed)
I spoke with the patient and informed her that her MRI brain is normal. Pt verbalized understanding and had no further questions. She stated she was locked out of mychart. I encouraged pt to call the help desk for assistance. She verbalized appreciation for the call.

## 2019-06-25 ENCOUNTER — Ambulatory Visit: Payer: Medicare Other | Attending: Internal Medicine

## 2019-06-25 DIAGNOSIS — Z23 Encounter for immunization: Secondary | ICD-10-CM

## 2019-07-20 ENCOUNTER — Ambulatory Visit: Payer: Medicare Other | Attending: Internal Medicine

## 2019-07-20 DIAGNOSIS — Z23 Encounter for immunization: Secondary | ICD-10-CM

## 2019-07-20 NOTE — Progress Notes (Signed)
   Covid-19 Vaccination Clinic  Name:  Katrina Harper    MRN: [id number] DOB: 1975/05/03  07/20/2019  Katrina Harper was observed post Covid-19 immunization for 15 minutes without incident. She was provided with Vaccine Information Sheet and instruction to access the V-Safe system.   Katrina Harper was instructed to call 911 with any severe reactions post vaccine: Marland Kitchen Difficulty breathing  . Swelling of face and throat  . A fast heartbeat  . A bad rash all over body  . Dizziness and weakness   Immunizations Administered    Name Date Dose VIS Date Route   Pfizer COVID-19 Vaccine 07/20/2019 11:43 AM 0.3 mL 05/26/2018 Intramuscular   Manufacturer: [employer], [employer]   Lot: [id number]   NDC: [address]-0

## 2019-08-04 ENCOUNTER — Encounter (HOSPITAL_BASED_OUTPATIENT_CLINIC_OR_DEPARTMENT_OTHER): Payer: Self-pay | Admitting: *Deleted

## 2019-08-04 ENCOUNTER — Other Ambulatory Visit: Payer: Self-pay

## 2019-08-04 ENCOUNTER — Emergency Department (HOSPITAL_BASED_OUTPATIENT_CLINIC_OR_DEPARTMENT_OTHER)
Admission: EM | Admit: 2019-08-04 | Discharge: 2019-08-04 | Disposition: A | Discharge status: Home / Self Care | Payer: Medicare Other | Attending: Emergency Medicine | Admitting: Emergency Medicine

## 2019-08-04 DIAGNOSIS — A599 Trichomoniasis, unspecified: Secondary | ICD-10-CM | POA: Insufficient documentation

## 2019-08-04 DIAGNOSIS — Z79899 Other long term (current) drug therapy: Secondary | ICD-10-CM | POA: Insufficient documentation

## 2019-08-04 DIAGNOSIS — F17210 Nicotine dependence, cigarettes, uncomplicated: Secondary | ICD-10-CM | POA: Diagnosis not present

## 2019-08-04 DIAGNOSIS — N760 Acute vaginitis: Secondary | ICD-10-CM | POA: Insufficient documentation

## 2019-08-04 DIAGNOSIS — Z888 Allergy status to other drugs, medicaments and biological substances status: Secondary | ICD-10-CM | POA: Diagnosis not present

## 2019-08-04 DIAGNOSIS — B9689 Other specified bacterial agents as the cause of diseases classified elsewhere: Secondary | ICD-10-CM

## 2019-08-04 DIAGNOSIS — Z202 Contact with and (suspected) exposure to infections with a predominantly sexual mode of transmission: Secondary | ICD-10-CM | POA: Diagnosis present

## 2019-08-04 DIAGNOSIS — A64 Unspecified sexually transmitted disease: Secondary | ICD-10-CM

## 2019-08-04 LAB — URINALYSIS, ROUTINE W REFLEX MICROSCOPIC
Bilirubin Urine: NEGATIVE
Glucose, UA: NEGATIVE mg/dL
Hgb urine dipstick: NEGATIVE
Ketones, ur: NEGATIVE mg/dL
Leukocytes,Ua: NEGATIVE
Nitrite: NEGATIVE
Protein, ur: NEGATIVE mg/dL
Specific Gravity, Urine: <1.005 — ABNORMAL LOW (ref 1.005–1.030)
pH: 5.5 (ref 5.0–8.0)

## 2019-08-04 LAB — PREGNANCY, URINE: Preg Test, Ur: NEGATIVE

## 2019-08-04 LAB — WET PREP, GENITAL
Sperm: NONE SEEN
Yeast Wet Prep HPF POC: NONE SEEN

## 2019-08-04 MED ORDER — CEFTRIAXONE SODIUM 500 MG IJ SOLR
500.0000 mg | Freq: Once | INTRAMUSCULAR | Status: AC
  Administered 2019-08-04: 14:00:00 500 mg via INTRAMUSCULAR
  Filled 2019-08-04: qty 500

## 2019-08-04 MED ORDER — DOXYCYCLINE HYCLATE 100 MG PO CAPS: 100.0000 mg | ORAL_CAPSULE | Freq: Two times a day (BID) | ORAL | 0 refills | Status: AC

## 2019-08-04 MED ORDER — METRONIDAZOLE 500 MG PO TABS: 500.0000 mg | ORAL_TABLET | Freq: Two times a day (BID) | ORAL | 0 refills | Status: AC

## 2019-08-04 MED FILL — METRONIDAZOLE 500 MG TABS: 500 | 7 days supply | Qty: 14 | Fill #0

## 2019-08-04 MED FILL — DOXYCYCLINE HYCLATE 100 MG: 100 | 7 days supply | Qty: 14 | Fill #0

## 2019-08-04 NOTE — Discharge Instructions (Signed)
The test came back positive for both bacterial vaginosis as well as Trichomonas.  Please take the Flagyl as prescribed for these 2 diseases.  Trichomonas is a sexually transmitted disease and he should notify all sexual transmitted partners and should not have further intercourse with them until they have received appropriate therapy.  Please additionally take the doxycycline as prescribed for possibility of chlamydial infection.  If you test positive for gonorrhea and chlamydia, additionally you should notify your sexual partners.  This test will hopefully come back tomorrow.  Recommend following safe sex practices as discussed which includes using condoms, limiting number of sexual partners.

## 2019-08-04 NOTE — ED Notes (Signed)
ED Provider at bedside. 

## 2019-08-04 NOTE — ED Provider Notes (Signed)
[facility] EMERGENCY DEPARTMENT Provider Note   CSN: [id number] Arrival date & time: 08/04/19  1237     History Chief Complaint  Patient presents with  . Exposure to STD    Katrina Harper is a 44 y.o. female.  I presents emergency department with concern for STD.  Patient reports that currently sexually active with 2 female partners, oral and vaginal intercourse, denies history of herpes, no other known STDs.  Concern about one of her female partners may have given her a sexually transmitted disease however she is unsure.  No new vaginal discharge.  Bipolar disorder, ADHD, herpes.  HPI     Past Medical History:  Diagnosis Date  . ADHD   . Bipolar 1 disorder (HCC)   . Bipolar depression (HCC)   . Depression   . Herpes     Patient Active Problem List   Diagnosis Date Noted  . Healthcare maintenance 07/02/2016  . Bipolar depression (HCC) 06/17/2016  . Genital herpes 05/10/2014    Past Surgical History:  Procedure Laterality Date  . INDUCED ABORTION       OB History    Gravida  2   Para      Term      Preterm      AB  2   Living        SAB      TAB  2   Ectopic      Multiple      Live Births              Family History  Problem Relation Age of Onset  . Hypertension Mother   . Alcohol abuse Mother   . Migraines Mother   . Other Mother        cognitive issues started recently  . Cancer Father        melanoma on eye  . Hypertension Father   . Alcohol abuse Father   . Arthritis Maternal Grandmother   . Mental illness Maternal Grandmother   . Alcohol abuse Maternal Grandfather   . Migraines Sister   . Hypertension Paternal Grandfather     Social History   Tobacco Use  . Smoking status: Current Every Day Smoker    Types: Cigars  . Smokeless tobacco: Never Used  . Tobacco comment:  smokes '3 bowls of cannabis' daily  Substance Use Topics  . Alcohol use: No    Alcohol/week: 0.0 standard drinks  . Drug use: Yes   Frequency: 1.0 times per week    Types: Marijuana    Comment: 3 bowls of cannibis daily    Home Medications Prior to Admission medications   Medication Sig Start Date End Date Taking? Authorizing Provider  amphetamine-dextroamphetamine (ADDERALL) 20 MG tablet Take 20 mg by mouth 4 (four) times daily.   Yes [provider]  ARIPiprazole (ABILIFY) 30 MG tablet Take 30 mg by mouth daily.   Yes [provider]  clonazePAM (KLONOPIN) 1 MG tablet Take 1 mg by mouth 3 (three) times daily as needed for anxiety.   Yes [provider]  valACYclovir (VALTREX) 500 MG tablet TAKE ONE TABLET BY MOUTH EVERY DAY 07/30/18  Yes Doles-Johnson, Teah, NP  doxycycline (VIBRAMYCIN) 100 MG capsule Take 1 capsule (100 mg total) by mouth 2 (two) times daily for 7 days. 08/04/19 08/11/19  Milagros Loll, MD  fluticasone (FLONASE) 50 MCG/ACT nasal spray Place 1 spray into both nostrils daily.    [provider]  ibuprofen (ADVIL,MOTRIN)  200 MG tablet Take 200 mg by mouth every 6 (six) hours as needed.    [provider]  metroNIDAZOLE (FLAGYL) 500 MG tablet Take 1 tablet (500 mg total) by mouth 2 (two) times daily for 7 days. 08/04/19 08/11/19  Milagros Loll, MD    Allergies    Lithium, Ortho tri-cyclen [norgestimate-eth estradiol], Symbyax [olanzapine-fluoxetine hcl], and Lamictal [lamotrigine]  Review of Systems   Review of Systems  Constitutional: Negative for chills and fever.  HENT: Negative for ear pain and sore throat.   Eyes: Negative for pain and visual disturbance.  Respiratory: Negative for cough and shortness of breath.   Cardiovascular: Negative for chest pain and palpitations.  Gastrointestinal: Negative for abdominal pain and vomiting.  Genitourinary: Negative for dysuria and hematuria.  Musculoskeletal: Negative for arthralgias and back pain.  Skin: Negative for color change and rash.  Neurological: Negative for seizures and syncope.  All other  systems reviewed and are negative.   Physical Exam Updated Vital Signs BP 122/89 (BP Location: Left Arm)   Pulse 97   Temp 98.5 F (36.9 C) (Oral)   Resp 18   Ht 5\' 6"  (1.676 m)   Wt 80.7 kg   LMP 08/01/2019   SpO2 100%   BMI 28.73 kg/m   Physical Exam Vitals and nursing note reviewed.  Constitutional:      General: She is not in acute distress.    Appearance: She is well-developed.  HENT:     Head: Normocephalic and atraumatic.  Eyes:     Conjunctiva/sclera: Conjunctivae normal.  Cardiovascular:     Rate and Rhythm: Normal rate.     Pulses: Normal pulses.  Pulmonary:     Effort: Pulmonary effort is normal. No respiratory distress.  Abdominal:     Palpations: Abdomen is soft.     Tenderness: There is no abdominal tenderness.     Comments: No tenderness  Genitourinary:    Comments: Small to moderate yellow discharge noted in vaginal vault, cervix appears normal Musculoskeletal:     Cervical back: Neck supple.  Skin:    General: Skin is warm and dry.  Neurological:     Mental Status: She is alert.  Psychiatric:        Mood and Affect: Mood normal.        Behavior: Behavior normal.     ED Results / Procedures / Treatments   Labs (all labs ordered are listed, but only abnormal results are displayed) Labs Reviewed  WET PREP, GENITAL - Abnormal; Notable for the following components:      Result Value   Trich, Wet Prep PRESENT (*)    Clue Cells Wet Prep HPF POC PRESENT (*)    WBC, Wet Prep HPF POC MANY (*)    All other components within normal limits  URINALYSIS, ROUTINE W REFLEX MICROSCOPIC - Abnormal; Notable for the following components:   Specific Gravity, Urine <1.005 (*)    All other components within normal limits  PREGNANCY, URINE  GC/CHLAMYDIA PROBE AMP ([place]) NOT AT [facility]    EKG None  Radiology No results found.  Procedures Procedures (including critical care time)  Medications Ordered in ED Medications  cefTRIAXone (ROCEPHIN)  injection 500 mg (500 mg Intramuscular Given 08/04/19 1412)    ED Course  I have reviewed the triage vital signs and the nursing notes.  Pertinent labs & imaging results that were available during my care of the patient were reviewed by me and considered in my medical decision  making (see chart for details).  Clinical Course as of Aug 03 1428  Wed Aug 04, 2019  [place]   [RD]    Clinical Course User Index [RD] Lucrezia Starch, MD   MDM Rules/Calculators/A&P                      44 year old lady presenting to the ER for STD check.  Noted yellow discharge on speculum exam.  Did not have any other complaints.  Provided presumptive treatment with Rocephin, Rx for doxycycline.  Gave Flagyl for BV and trichomonas.  Patient instructed on safe sex practices and reviewed need to notify sexual partners of this new diagnosis.    After the discussed management above, the patient was determined to be safe for discharge.  The patient was in agreement with this plan and all questions regarding their care were answered.  ED return precautions were discussed and the patient will return to the ED with any significant worsening of condition.   Final Clinical Impression(s) / ED Diagnoses Final diagnoses:  STD (female)  Trichomonas infection  Bacterial vaginosis    Rx / DC Orders ED Discharge Orders         Ordered    metroNIDAZOLE (FLAGYL) 500 MG tablet  2 times daily     08/04/19 1415    doxycycline (VIBRAMYCIN) 100 MG capsule  2 times daily     08/04/19 1415           Lucrezia Starch, MD 08/04/19 1430

## 2019-08-04 NOTE — ED Triage Notes (Signed)
Pt c/o std exposure

## 2019-08-05 LAB — GC/CHLAMYDIA PROBE AMP (~~LOC~~) NOT AT ARMC
Chlamydia: NEGATIVE
Comment: NEGATIVE
Comment: NORMAL
Neisseria Gonorrhea: NEGATIVE

## 2019-08-16 ENCOUNTER — Other Ambulatory Visit: Payer: Self-pay

## 2019-08-16 ENCOUNTER — Encounter (HOSPITAL_BASED_OUTPATIENT_CLINIC_OR_DEPARTMENT_OTHER): Payer: Self-pay

## 2019-08-16 ENCOUNTER — Emergency Department (HOSPITAL_BASED_OUTPATIENT_CLINIC_OR_DEPARTMENT_OTHER)
Admission: EM | Admit: 2019-08-16 | Discharge: 2019-08-16 | Disposition: A | Discharge status: Home / Self Care | Payer: Medicare Other | Attending: Emergency Medicine | Admitting: Emergency Medicine

## 2019-08-16 DIAGNOSIS — Z3202 Encounter for pregnancy test, result negative: Secondary | ICD-10-CM | POA: Diagnosis not present

## 2019-08-16 DIAGNOSIS — Z113 Encounter for screening for infections with a predominantly sexual mode of transmission: Secondary | ICD-10-CM | POA: Diagnosis present

## 2019-08-16 DIAGNOSIS — Z0000 Encounter for general adult medical examination without abnormal findings: Secondary | ICD-10-CM | POA: Diagnosis not present

## 2019-08-16 DIAGNOSIS — Z79899 Other long term (current) drug therapy: Secondary | ICD-10-CM | POA: Insufficient documentation

## 2019-08-16 DIAGNOSIS — F909 Attention-deficit hyperactivity disorder, unspecified type: Secondary | ICD-10-CM | POA: Insufficient documentation

## 2019-08-16 DIAGNOSIS — F1210 Cannabis abuse, uncomplicated: Secondary | ICD-10-CM | POA: Diagnosis not present

## 2019-08-16 DIAGNOSIS — Z8619 Personal history of other infectious and parasitic diseases: Secondary | ICD-10-CM | POA: Insufficient documentation

## 2019-08-16 DIAGNOSIS — F17290 Nicotine dependence, other tobacco product, uncomplicated: Secondary | ICD-10-CM | POA: Insufficient documentation

## 2019-08-16 LAB — WET PREP, GENITAL
Sperm: NONE SEEN
Trich, Wet Prep: NONE SEEN
Yeast Wet Prep HPF POC: NONE SEEN

## 2019-08-16 LAB — URINALYSIS, ROUTINE W REFLEX MICROSCOPIC
Bilirubin Urine: NEGATIVE
Glucose, UA: NEGATIVE mg/dL
Hgb urine dipstick: NEGATIVE
Ketones, ur: NEGATIVE mg/dL
Leukocytes,Ua: NEGATIVE
Nitrite: NEGATIVE
Protein, ur: NEGATIVE mg/dL
Specific Gravity, Urine: 1.025 (ref 1.005–1.030)
pH: 6 (ref 5.0–8.0)

## 2019-08-16 LAB — PREGNANCY, URINE: Preg Test, Ur: NEGATIVE

## 2019-08-16 NOTE — ED Provider Notes (Signed)
[place] EMERGENCY DEPARTMENT Provider Note   CSN: [id number] Arrival date & time: 08/16/19  0913     History Chief Complaint  Patient presents with  . SEXUALLY TRANSMITTED DISEASE    Katrina Harper is a 44 y.o. female.  Patient presents to the emergency department for test of cure for trichomonas that she was diagnosed with about 2 weeks ago.  Patient states she was told by a partner that they were having "burning".  She came in to be tested for STIs and wet prep showed trichomonas.  She states that she took the medication as directed but have 1 day where she vomited and is unsure if she kept on the medicine.  She is not having any symptoms.  She would like to be checked to ensure that the trichomonas is gone and to be retested for pregnancy.  She denies any vaginal bleeding or discharge.  No urinary symptoms.  No other treatments.        Past Medical History:  Diagnosis Date  . ADHD   . Bipolar 1 disorder ([place])   . Bipolar depression ([place])   . Depression   . Herpes     Patient Active Problem List   Diagnosis Date Noted  . Healthcare maintenance 07/02/2016  . Bipolar depression ([place]) 06/17/2016  . Genital herpes 05/10/2014    Past Surgical History:  Procedure Laterality Date  . INDUCED ABORTION       OB History    Gravida  2   Para      Term      Preterm      AB  2   Living        SAB      TAB  2   Ectopic      Multiple      Live Births              Family History  Problem Relation Age of Onset  . Hypertension Mother   . Alcohol abuse Mother   . Migraines Mother   . Other Mother        cognitive issues started recently  . Cancer Father        melanoma on eye  . Hypertension Father   . Alcohol abuse Father   . Arthritis Maternal Grandmother   . Mental illness Maternal Grandmother   . Alcohol abuse Maternal Grandfather   . Migraines Sister   . Hypertension Paternal Grandfather     Social History   Tobacco Use    . Smoking status: Current Every Day Smoker    Types: Cigars  . Smokeless tobacco: Never Used  . Tobacco comment:  smokes '3 bowls of cannabis' daily  Substance Use Topics  . Alcohol use: No    Alcohol/week: 0.0 standard drinks  . Drug use: Yes    Frequency: 1.0 times per week    Types: Marijuana    Comment: 3 bowls of cannibis daily    Home Medications Prior to Admission medications   Medication Sig Start Date End Date Taking? Authorizing Provider  amphetamine-dextroamphetamine (ADDERALL) 20 MG tablet Take 20 mg by mouth 4 (four) times daily.    [provider]  ARIPiprazole (ABILIFY) 30 MG tablet Take 30 mg by mouth daily.    [provider]  clonazePAM (KLONOPIN) 1 MG tablet Take 1 mg by mouth 3 (three) times daily as needed for anxiety.    [provider]  fluticasone (FLONASE) 50 MCG/ACT nasal spray Place 1  spray into both nostrils daily.    [provider]  ibuprofen (ADVIL,MOTRIN) 200 MG tablet Take 200 mg by mouth every 6 (six) hours as needed.    [provider]  valACYclovir (VALTREX) 500 MG tablet TAKE ONE TABLET BY MOUTH EVERY DAY 07/30/18   Doles-Johnson, Teah, NP    Allergies    Lithium, Ortho tri-cyclen [norgestimate-eth estradiol], Symbyax [olanzapine-fluoxetine hcl], and Lamictal [lamotrigine]  Review of Systems   Review of Systems  Constitutional: Negative for fever.  HENT: Negative for rhinorrhea and sore throat.   Eyes: Negative for redness.  Respiratory: Negative for cough.   Cardiovascular: Negative for chest pain.  Gastrointestinal: Negative for abdominal pain, diarrhea, nausea and vomiting.  Genitourinary: Negative for dysuria, frequency, hematuria, vaginal bleeding and vaginal discharge.  Musculoskeletal: Negative for myalgias.  Skin: Negative for rash.  Neurological: Negative for headaches.    Physical Exam Updated Vital Signs BP 128/86 (BP Location: Right Arm)   Pulse 97   Temp 98.3 F (36.8 C)  (Oral)   Resp 16   Ht 5\' 6"  (1.676 m)   Wt 80.7 kg   LMP 08/01/2019   SpO2 100%   BMI 28.73 kg/m   Physical Exam Vitals and nursing note reviewed. Exam conducted with a chaperone present.  Constitutional:      Appearance: She is well-developed.  HENT:     Head: Normocephalic and atraumatic.  Eyes:     General:        Right eye: No discharge.        Left eye: No discharge.     Conjunctiva/sclera: Conjunctivae normal.  Cardiovascular:     Rate and Rhythm: Normal rate and regular rhythm.     Heart sounds: Normal heart sounds.  Pulmonary:     Effort: Pulmonary effort is normal.     Breath sounds: Normal breath sounds.  Abdominal:     Palpations: Abdomen is soft.     Tenderness: There is no abdominal tenderness.  Genitourinary:    Exam position: Lithotomy position.     Labia:        Right: No tenderness.        Left: No tenderness.      Vagina: Vaginal discharge (Thick, white) present. No tenderness.     Cervix: Normal.     Uterus: Not enlarged and not tender.      Adnexa: Right adnexa normal and left adnexa normal.       Right: No mass or tenderness.         Left: No mass or tenderness.    Musculoskeletal:     Cervical back: Normal range of motion and neck supple.  Skin:    General: Skin is warm and dry.  Neurological:     Mental Status: She is alert.     ED Results / Procedures / Treatments   Labs (all labs ordered are listed, but only abnormal results are displayed) Labs Reviewed  WET PREP, GENITAL  URINALYSIS, ROUTINE W REFLEX MICROSCOPIC  PREGNANCY, URINE  GC/CHLAMYDIA PROBE AMP ([place]) NOT AT [facility]    EKG None  Radiology No results found.  Procedures Procedures (including critical care time)  Medications Ordered in ED Medications - No data to display  ED Course  I have reviewed the triage vital signs and the nursing notes.  Pertinent labs & imaging results that were available during my care of the patient were reviewed by me and  considered in my medical decision making (see chart for details).  Patient seen and examined.  Pelvic exam performed with RN chaperone.  Awaiting wet prep and urine results.  Vital signs reviewed and are as follows: BP 128/86 (BP Location: Right Arm)   Pulse 97   Temp 98.3 F (36.8 C) (Oral)   Resp 16   Ht 5\' 6"  (1.676 m)   Wt 80.7 kg   LMP 08/01/2019   SpO2 100%   BMI 28.73 kg/m   Result negative.  Patient informed.  Encouraged routine follow-up.    MDM Rules/Calculators/A&P                      Patient here for repeat pregnancy test and testing for trichomonas.  Testing negative today.  No other medical concerns identified.   Final Clinical Impression(s) / ED Diagnoses Final diagnoses:  History of trichomoniasis  Normal exam  Negative pregnancy test    Rx / DC Orders ED Discharge Orders    None       10/01/2019, PA-C 08/16/19 1027    08/18/19, MD 08/16/19 1048

## 2019-08-16 NOTE — ED Notes (Signed)
Pt states, "I want a pregnancy test too to make sure my morning after pill worked".

## 2019-08-16 NOTE — Discharge Instructions (Addendum)
Repeat testing for trichomonas is negative today.  Pregnancy test is negative.  Repeat testing for gonorrhea and chlamydia pending.

## 2019-08-16 NOTE — ED Notes (Signed)
ED Provider at bedside. 

## 2019-08-16 NOTE — ED Triage Notes (Signed)
Pt states that she was recently exposed to trich reports that she took medication for treatment states that she wants to confirm that the medication worked. Denies any symptoms.

## 2019-08-17 LAB — GC/CHLAMYDIA PROBE AMP (~~LOC~~) NOT AT ARMC
Chlamydia: NEGATIVE
Comment: NEGATIVE
Comment: NORMAL
Neisseria Gonorrhea: NEGATIVE

## 2020-04-17 ENCOUNTER — Emergency Department (HOSPITAL_BASED_OUTPATIENT_CLINIC_OR_DEPARTMENT_OTHER)
Admission: EM | Admit: 2020-04-17 | Discharge: 2020-04-17 | Disposition: A | Discharge status: Home / Self Care | Payer: Medicare HMO | Attending: Emergency Medicine | Admitting: Emergency Medicine

## 2020-04-17 ENCOUNTER — Other Ambulatory Visit: Payer: Self-pay

## 2020-04-17 ENCOUNTER — Encounter (HOSPITAL_BASED_OUTPATIENT_CLINIC_OR_DEPARTMENT_OTHER): Payer: Self-pay | Admitting: *Deleted

## 2020-04-17 DIAGNOSIS — Z5321 Procedure and treatment not carried out due to patient leaving prior to being seen by health care provider: Secondary | ICD-10-CM | POA: Insufficient documentation

## 2020-04-17 DIAGNOSIS — Z202 Contact with and (suspected) exposure to infections with a predominantly sexual mode of transmission: Secondary | ICD-10-CM | POA: Diagnosis not present

## 2020-04-17 LAB — URINALYSIS, MICROSCOPIC (REFLEX)

## 2020-04-17 LAB — URINALYSIS, ROUTINE W REFLEX MICROSCOPIC
Bilirubin Urine: NEGATIVE
Glucose, UA: NEGATIVE mg/dL
Hgb urine dipstick: NEGATIVE
Ketones, ur: NEGATIVE mg/dL
Nitrite: NEGATIVE
Protein, ur: NEGATIVE mg/dL
Specific Gravity, Urine: 1.02 (ref 1.005–1.030)
pH: 6 (ref 5.0–8.0)

## 2020-04-17 LAB — PREGNANCY, URINE: Preg Test, Ur: NEGATIVE

## 2020-04-17 NOTE — ED Triage Notes (Addendum)
C/o STD exposure of trich, denies vaginal discharge

## 2021-01-11 ENCOUNTER — Encounter: Payer: Self-pay | Admitting: Radiology

## 2021-01-23 ENCOUNTER — Other Ambulatory Visit: Payer: Self-pay

## 2021-01-23 ENCOUNTER — Other Ambulatory Visit (HOSPITAL_COMMUNITY)
Admission: RE | Admit: 2021-01-23 | Discharge: 2021-01-23 | Disposition: A | Discharge status: Home / Self Care | Payer: Medicare HMO | Source: Ambulatory Visit | Attending: Obstetrics and Gynecology | Admitting: Obstetrics and Gynecology

## 2021-01-23 ENCOUNTER — Ambulatory Visit (INDEPENDENT_AMBULATORY_CARE_PROVIDER_SITE_OTHER): Payer: Medicare HMO | Admitting: Obstetrics and Gynecology

## 2021-01-23 ENCOUNTER — Encounter: Payer: Self-pay | Admitting: Obstetrics and Gynecology

## 2021-01-23 VITALS — BP 154/100 | HR 86 | Ht 66.0 in | Wt 170.0 lb

## 2021-01-23 DIAGNOSIS — Z01419 Encounter for gynecological examination (general) (routine) without abnormal findings: Secondary | ICD-10-CM | POA: Diagnosis present

## 2021-01-23 DIAGNOSIS — Z30430 Encounter for insertion of intrauterine contraceptive device: Secondary | ICD-10-CM

## 2021-01-23 DIAGNOSIS — Z1151 Encounter for screening for human papillomavirus (HPV): Secondary | ICD-10-CM | POA: Diagnosis not present

## 2021-01-23 DIAGNOSIS — Z124 Encounter for screening for malignant neoplasm of cervix: Secondary | ICD-10-CM | POA: Insufficient documentation

## 2021-01-23 MED ORDER — PARAGARD INTRAUTERINE COPPER IU IUD
1.0000 | INTRAUTERINE_SYSTEM | Freq: Once | INTRAUTERINE | Status: AC
  Administered 2021-01-23: 1 via INTRAUTERINE

## 2021-01-23 NOTE — Progress Notes (Signed)
Subjective:     Katrina Harper is a 45 y.o. female P0 with BMI 27 who is here for evaluation of an abnormal pap smear. Patient was seen for a physical by PCP on 01/02/21 and was found to have ASCUS pap smear. HPV co-testing not done. Patient reports a history of abnormal pap smear treated with a LEEP in her 20's. Patient felt that her questions were dismissed and presented here for further evaluation and repeat pap smear. Patient reports a monthly period lasting 3-4 days. She is sexually active without contraception and is interested in copper IUD. Patient is without any other complaints.  Past Medical History:  Diagnosis Date   ADHD    Bipolar 1 disorder (HCC)    Bipolar depression (HCC)    Depression    Herpes    Past Surgical History:  Procedure Laterality Date   INDUCED ABORTION     Family History  Problem Relation Age of Onset   Hypertension Mother    Alcohol abuse Mother    Migraines Mother    Other Mother        cognitive issues started recently   Cancer Father        melanoma on eye   Hypertension Father    Alcohol abuse Father    Arthritis Maternal Grandmother    Mental illness Maternal Grandmother    Alcohol abuse Maternal Grandfather    Migraines Sister    Hypertension Paternal Grandfather     Social History   Socioeconomic History   Marital status: Single    Spouse name: Not on file   Number of children: 0   Years of education: Not on file   Highest education level: Bachelor's degree (e.g., BA, AB, BS)  Occupational History   Not on file  Tobacco Use   Smoking status: Every Day    Types: Cigars   Smokeless tobacco: Never   Tobacco comments:     smokes '3 bowls of cannabis' daily  Vaping Use   Vaping Use: Former  Substance and Sexual Activity   Alcohol use: No    Alcohol/week: 0.0 standard drinks   Drug use: Yes    Frequency: 1.0 times per week    Types: Marijuana    Comment: 3 bowls of cannibis daily   Sexual activity: Yes    Partners: Male     Birth control/protection: None  Other Topics Concern   Not on file  Social History Narrative   Pt did not want to answer questions       - said she was looking into getting a car but that's all          Update 01/13/2019   Lives at home alone   Right handed   Social Determinants of Health   Financial Resource Strain: Not on file  Food Insecurity: Not on file  Transportation Needs: Not on file  Physical Activity: Not on file  Stress: Not on file  Social Connections: Not on file  Intimate Partner Violence: Not on file   Health Maintenance  Topic Date Due   Pneumococcal Vaccine 94-45 Years old (1 - PCV) Never done   Hepatitis C Screening  Never done   TETANUS/TDAP  Never done   PAP SMEAR-Modifier  Never done   COVID-19 Vaccine (3 - Booster for Pfizer series) 09/14/2019   INFLUENZA VACCINE  Never done   HIV Screening  Completed   HPV VACCINES  Aged Out       Review of Systems Pertinent  items noted in HPI and remainder of comprehensive ROS otherwise negative.   Objective:  Blood pressure (!) 154/100, pulse 86, height 5\' 6"  (1.676 m), weight 170 lb (77.1 kg).    GENERAL: Well-developed, well-nourished female in no acute distress.  ABDOMEN: Soft, nontender, nondistended. No organomegaly. PELVIC: Normal external female genitalia. Vagina is pink and rugated.  Normal discharge. Normal appearing cervix. Uterus is normal in size. No adnexal mass or tenderness. Chaperone present during the pelvic exam EXTREMITIES: No cyanosis, clubbing, or edema, 2+ distal pulses.     Assessment:    Healthy female exam.      Plan:    Pap smear collected per patient request with HPV co-testing.  Screening mammogram scheduled per patient Health maintenance labs with PCP in a few weeks per patient Patient is aware of HTN and has follow up appointment Patient will be contacted with abnormal results IUD Procedure Note Patient identified, informed consent performed, signed copy in chart,  time out was performed.  Urine pregnancy test negative.  Speculum placed in the vagina.  Cervix visualized.  Cleaned with Betadine x 2.  Grasped anteriorly with a single tooth tenaculum.  Uterus sounded to 8 cm.  Paraguard IUD placed per manufacturer's recommendations.  Strings trimmed to 3 cm. Tenaculum was removed, good hemostasis noted.  Patient tolerated procedure well.   Patient given post procedure instructions and Paraguard care card with expiration date.  Patient is asked to check IUD strings periodically and follow up in 4-6 weeks for IUD check.  See After Visit Summary for Counseling Recommendations

## 2021-01-23 NOTE — Progress Notes (Signed)
New Gyn presents for problem visit today.  LMP: 01/16/21 cycles last 3-4 days heavy first 2 days pt notes cramps and clots as well Last pap: 01/02/21  CC: pt notes pain w/ intercourse describes pain as sharp knife like pain, irregular bleeding. Recently treated for BV and yeast. Pt denies any sx's of BV and yeast at this time.

## 2021-01-26 LAB — CYTOLOGY - PAP
Comment: NEGATIVE
Diagnosis: UNDETERMINED — AB
High risk HPV: NEGATIVE

## 2021-02-20 ENCOUNTER — Ambulatory Visit (INDEPENDENT_AMBULATORY_CARE_PROVIDER_SITE_OTHER): Payer: Medicare HMO | Admitting: Obstetrics and Gynecology

## 2021-02-20 ENCOUNTER — Other Ambulatory Visit: Payer: Self-pay

## 2021-02-20 ENCOUNTER — Other Ambulatory Visit (HOSPITAL_BASED_OUTPATIENT_CLINIC_OR_DEPARTMENT_OTHER): Payer: Self-pay

## 2021-02-20 VITALS — BP 129/84 | HR 94 | Wt 167.0 lb

## 2021-02-20 DIAGNOSIS — Z30431 Encounter for routine checking of intrauterine contraceptive device: Secondary | ICD-10-CM

## 2021-02-20 MED ORDER — VALACYCLOVIR HCL 500 MG PO TABS: ORAL_TABLET | ORAL | 3 refills | Status: DC

## 2021-02-20 MED ORDER — FLUTICASONE PROPIONATE 50 MCG/ACT NA SUSP: 1.0000 | Freq: Every day | NASAL | 3 refills | Status: DC

## 2021-02-20 MED ORDER — FLUTICASONE PROPIONATE 50 MCG/ACT NA SUSP
1.0000 | Freq: Every day | NASAL | 3 refills | Status: DC
  Filled 2021-02-20: qty 15.8, fill #0

## 2021-02-20 MED ORDER — VALACYCLOVIR HCL 500 MG PO TABS
ORAL_TABLET | ORAL | 3 refills | Status: DC
  Filled 2021-02-20: qty 70, fill #0

## 2021-02-20 NOTE — Progress Notes (Signed)
Obstetrics and Gynecology Visit Return Patient Evaluation  Appointment Date: 02/20/2021  Primary Care Provider: Zachery Dauer  OBGYN Clinic: [facility]  Chief Complaint: IUD check up  History of Present Illness:  Katrina Harper is a 44 y.o. s/p 10/25 paragard iud insertion.  Period was longer and heavier than prior. She has had sex and no issues  Review of Systems: as noted in the History of Present Illness.   Medications:  [name] had no medications administered during this visit. Current Outpatient Medications  Medication Sig Dispense Refill   amphetamine-dextroamphetamine (ADDERALL) 20 MG tablet Take 20 mg by mouth 4 (four) times daily.     ARIPiprazole (ABILIFY) 30 MG tablet Take 30 mg by mouth daily.     clonazePAM (KLONOPIN) 1 MG tablet Take 1 mg by mouth 3 (three) times daily as needed for anxiety.     guanFACINE (INTUNIV) 2 MG TB24 ER tablet Take 2 mg by mouth every morning.     PARAGARD INTRAUTERINE COPPER IU by Intrauterine route.     fluticasone (FLONASE) 50 MCG/ACT nasal spray Place 1 spray into both nostrils daily. 15.8 mL 3   ibuprofen (ADVIL,MOTRIN) 200 MG tablet Take 200 mg by mouth every 6 (six) hours as needed.     valACYclovir (VALTREX) 500 MG tablet 1 tab po qday. Take 1 tab po bid x 3 days for an outbreak 70 tablet 3   No current facility-administered medications for this visit.    Allergies: is allergic to lithium, ortho tri-cyclen [norgestimate-eth estradiol], symbyax [olanzapine-fluoxetine hcl], and lamictal [lamotrigine].  Physical Exam:  BP 129/84   Pulse 94   Wt 167 lb (75.8 kg)   BMI 26.95 kg/m  Body mass index is 26.95 kg/m. General appearance: Well nourished, well developed female in no acute distress.  Abdomen: diffusely non tender to palpation, non distended, and no masses, hernias Neuro/Psych:  Normal mood and affect.    Pelvic exam:  EGBUS: normal Vaginal vault: normal Cervix:  IUD strings  seen and 3-4cm, currled at the os Bimanual: negative  Assessment: pt doing well  Plan:  1. IUD check up Pt declines STI screening. Pap asucs/hpv neg last visit>>rpt in 3 years  Patient needs new PCP. Suppressive valtrex and flonase sent in   RTC: PRN  Cornelia Copa MD Attending Center for [facility])

## 2021-03-13 ENCOUNTER — Other Ambulatory Visit: Payer: Self-pay

## 2021-03-13 DIAGNOSIS — N898 Other specified noninflammatory disorders of vagina: Secondary | ICD-10-CM

## 2021-03-13 MED ORDER — METRONIDAZOLE 500 MG PO TABS
500.0000 mg | ORAL_TABLET | Freq: Two times a day (BID) | ORAL | 0 refills | Status: DC
Start: 2021-03-13 — End: 2021-08-28

## 2021-03-13 MED ORDER — FLUCONAZOLE 150 MG PO TABS
150.0000 mg | ORAL_TABLET | Freq: Once | ORAL | 0 refills | Status: AC
Start: 2021-03-13 — End: 2021-03-13

## 2021-03-13 NOTE — Progress Notes (Signed)
Return call to pt regarding Rx Request for BV sx's. Pt made aware to allow 7-10days for sx's to clear also sent diflucan pt notes yeast after taking flagyl.  Pt agreeable and voiced understanding.  Pharmacy confirmed Rx sent.

## 2021-05-02 ENCOUNTER — Ambulatory Visit: Payer: Medicare HMO

## 2021-08-28 ENCOUNTER — Other Ambulatory Visit: Payer: Self-pay | Admitting: *Deleted

## 2021-08-28 DIAGNOSIS — N898 Other specified noninflammatory disorders of vagina: Secondary | ICD-10-CM

## 2021-08-28 MED ORDER — METRONIDAZOLE 500 MG PO TABS: 500.0000 mg | ORAL_TABLET | Freq: Two times a day (BID) | ORAL | 0 refills | Status: DC

## 2021-10-10 ENCOUNTER — Other Ambulatory Visit: Payer: Self-pay

## 2021-10-10 DIAGNOSIS — N898 Other specified noninflammatory disorders of vagina: Secondary | ICD-10-CM

## 2021-10-10 MED ORDER — FLUCONAZOLE 150 MG PO TABS: 150.0000 mg | ORAL_TABLET | Freq: Once | ORAL | 0 refills | Status: AC

## 2021-10-10 NOTE — Progress Notes (Signed)
Pt called requesting Rx for yeast. Rx sent per protocol.    

## 2021-10-30 ENCOUNTER — Other Ambulatory Visit: Payer: Self-pay | Admitting: *Deleted

## 2021-10-30 DIAGNOSIS — N898 Other specified noninflammatory disorders of vagina: Secondary | ICD-10-CM

## 2021-10-30 MED ORDER — METRONIDAZOLE 500 MG PO TABS: 500.0000 mg | ORAL_TABLET | Freq: Two times a day (BID) | ORAL | 0 refills | Status: DC

## 2021-11-22 ENCOUNTER — Telehealth: Payer: Self-pay

## 2021-11-22 NOTE — Telephone Encounter (Signed)
Pt called in asking about a telehealth visit and to provide her correct e-mail address. Called nurse line to connect pt to correct department. Nurse on the line and conference call with pt.

## 2021-12-28 ENCOUNTER — Ambulatory Visit: Payer: Medicare HMO | Admitting: Cardiovascular Disease

## 2021-12-31 ENCOUNTER — Other Ambulatory Visit: Payer: Self-pay | Admitting: Obstetrics & Gynecology

## 2021-12-31 DIAGNOSIS — N898 Other specified noninflammatory disorders of vagina: Secondary | ICD-10-CM

## 2022-01-02 ENCOUNTER — Ambulatory Visit: Payer: Medicare HMO | Attending: Cardiovascular Disease | Admitting: Cardiovascular Disease

## 2022-01-16 ENCOUNTER — Ambulatory Visit
Admission: RE | Admit: 2022-01-16 | Discharge: 2022-01-16 | Disposition: A | Discharge status: Home / Self Care | Payer: Medicare HMO | Source: Ambulatory Visit | Attending: Urgent Care | Admitting: Urgent Care

## 2022-01-16 VITALS — BP 159/88 | HR 76 | Temp 98.3°F | Resp 16

## 2022-01-16 DIAGNOSIS — J019 Acute sinusitis, unspecified: Secondary | ICD-10-CM

## 2022-01-16 DIAGNOSIS — F1290 Cannabis use, unspecified, uncomplicated: Secondary | ICD-10-CM | POA: Diagnosis not present

## 2022-01-16 DIAGNOSIS — F319 Bipolar disorder, unspecified: Secondary | ICD-10-CM

## 2022-01-16 DIAGNOSIS — J309 Allergic rhinitis, unspecified: Secondary | ICD-10-CM

## 2022-01-16 DIAGNOSIS — Z720 Tobacco use: Secondary | ICD-10-CM

## 2022-01-16 MED ORDER — PSEUDOEPHEDRINE HCL 60 MG PO TABS: 60.0000 mg | ORAL_TABLET | Freq: Four times a day (QID) | ORAL | 0 refills | Status: DC | PRN

## 2022-01-16 MED ORDER — LEVOCETIRIZINE DIHYDROCHLORIDE 5 MG PO TABS
5.0000 mg | ORAL_TABLET | Freq: Every evening | ORAL | 0 refills | Status: AC
Start: 2022-01-16 — End: ?

## 2022-01-16 MED ORDER — AMOXICILLIN-POT CLAVULANATE 875-125 MG PO TABS: 1.0000 | ORAL_TABLET | Freq: Two times a day (BID) | ORAL | 0 refills | Status: DC

## 2022-01-16 NOTE — ED Provider Notes (Signed)
[facility] - URGENT CARE CENTER  Note:  This document was prepared using Conservation officer, historic buildings and may include unintentional dictation errors.  MRN: [id number] DOB: 1976-02-02  Subjective:   Katrina Harper is a 46 y.o. female presenting for 1 week history of persistent and worsening sinus congestion, bilateral ear pressure, coughing.  No chest pain, shortness of breath or wheezing.  Patient's primary concern is about a sinus infection.  She did a COVID test that was negative.  Does not want a repeat.  She does vape, smokes marijuana.  No cigarettes or cigars.  No history of respiratory disorders.  No current facility-administered medications for this encounter.  Current Outpatient Medications:    amphetamine-dextroamphetamine (ADDERALL) 20 MG tablet, Take 20 mg by mouth 4 (four) times daily., Disp: , Rfl:    ARIPiprazole (ABILIFY) 30 MG tablet, Take 30 mg by mouth daily., Disp: , Rfl:    clonazePAM (KLONOPIN) 1 MG tablet, Take 1 mg by mouth 3 (three) times daily as needed for anxiety., Disp: , Rfl:    fluticasone (FLONASE) 50 MCG/ACT nasal spray, Place 1 spray into both nostrils daily., Disp: 15.8 mL, Rfl: 3   guanFACINE (INTUNIV) 2 MG TB24 ER tablet, Take 2 mg by mouth every morning., Disp: , Rfl:    ibuprofen (ADVIL,MOTRIN) 200 MG tablet, Take 200 mg by mouth every 6 (six) hours as needed., Disp: , Rfl:    metroNIDAZOLE (FLAGYL) 500 MG tablet, TAKE 1 TABLET (500 MG TOTAL) BY MOUTH 2 (TWO) TIMES DAILY., Disp: 14 tablet, Rfl: 0   PARAGARD INTRAUTERINE COPPER IU, by Intrauterine route., Disp: , Rfl:    valACYclovir (VALTREX) 500 MG tablet, 1 tab po qday. Take 1 tab po bid x 3 days for an outbreak, Disp: 70 tablet, Rfl: 3   Allergies  Allergen Reactions   Lithium     "makes me toxic"   Ortho Tri-Cyclen [Norgestimate-Eth Estradiol] Other (See Comments)    "aggrivates bipolar"   Symbyax [Olanzapine-Fluoxetine Hcl] Other (See Comments)    "gained 40lb in 30 days"    Lamictal [Lamotrigine] Rash    Past Medical History:  Diagnosis Date   ADHD    Bipolar 1 disorder (HCC)    Bipolar depression (HCC)    Depression    Herpes      Past Surgical History:  Procedure Laterality Date   INDUCED ABORTION      Family History  Problem Relation Age of Onset   Hypertension Mother    Alcohol abuse Mother    Migraines Mother    Other Mother        cognitive issues started recently   Cancer Father        melanoma on eye   Hypertension Father    Alcohol abuse Father    Arthritis Maternal Grandmother    Mental illness Maternal Grandmother    Alcohol abuse Maternal Grandfather    Migraines Sister    Hypertension Paternal Grandfather     Social History   Tobacco Use   Smoking status: Every Day    Types: Cigars   Smokeless tobacco: Never   Tobacco comments:     smokes '3 bowls of cannabis' daily  Vaping Use   Vaping Use: Former  Substance Use Topics   Alcohol use: No    Alcohol/week: 0.0 standard drinks of alcohol   Drug use: Yes    Frequency: 1.0 times per week    Types: Marijuana    Comment: 3 bowls of cannibis daily  ROS   Objective:   Vitals: BP (!) 159/88 (BP Location: Right Arm)   Pulse 76   Temp 98.3 F (36.8 C) (Oral)   Resp 16   LMP 01/09/2022 (Approximate)   SpO2 98%   Physical Exam Constitutional:      General: She is not in acute distress.    Appearance: Normal appearance. She is well-developed and normal weight. She is not ill-appearing, toxic-appearing or diaphoretic.  HENT:     Head: Normocephalic and atraumatic.     Right Ear: Tympanic membrane, ear canal and external ear normal. No drainage or tenderness. No middle ear effusion. There is no impacted cerumen. Tympanic membrane is not erythematous.     Left Ear: Tympanic membrane, ear canal and external ear normal. No drainage or tenderness.  No middle ear effusion. There is no impacted cerumen. Tympanic membrane is not erythematous.     Nose: Congestion and  rhinorrhea present.     Mouth/Throat:     Mouth: Mucous membranes are moist. No oral lesions.     Pharynx: No pharyngeal swelling, oropharyngeal exudate, posterior oropharyngeal erythema or uvula swelling.     Tonsils: No tonsillar exudate or tonsillar abscesses.  Eyes:     General: No scleral icterus.       Right eye: No discharge.        Left eye: No discharge.     Extraocular Movements: Extraocular movements intact.     Right eye: Normal extraocular motion.     Left eye: Normal extraocular motion.     Conjunctiva/sclera: Conjunctivae normal.  Cardiovascular:     Rate and Rhythm: Normal rate and regular rhythm.     Heart sounds: Normal heart sounds. No murmur heard.    No friction rub. No gallop.  Pulmonary:     Effort: Pulmonary effort is normal. No respiratory distress.     Breath sounds: No stridor. No wheezing, rhonchi or rales.  Chest:     Chest wall: No tenderness.  Musculoskeletal:     Cervical back: Normal range of motion and neck supple.  Lymphadenopathy:     Cervical: No cervical adenopathy.  Skin:    General: Skin is warm and dry.  Neurological:     General: No focal deficit present.     Mental Status: She is alert and oriented to person, place, and time.  Psychiatric:        Mood and Affect: Mood normal.        Behavior: Behavior normal.     Assessment and Plan :   PDMP not reviewed this encounter.  1. Acute sinusitis, recurrence not specified, unspecified location   2. Allergic rhinitis, unspecified seasonality, unspecified trigger   3. Vapes nicotine containing substance   4. Marijuana use   5. Bipolar affective disorder, remission status unspecified ([place])     Discussed risks of abusing prednisone in the context of her bipolar disorder and we both agreed to avoid it for the same reasons. Will start empiric treatment for sinusitis with Augmentin.  Recommended supportive care otherwise including the use of Xyzal long-term, decongestant as needed.  Counseled patient on potential for adverse effects with medications prescribed/recommended today, ER and return-to-clinic precautions discussed, patient verbalized understanding.    Jaynee Eagles, [place] 01/16/22 [phone]

## 2022-01-16 NOTE — ED Triage Notes (Signed)
Pt states cough,nasal congestion,pressure in her ears for the past week. States she took Covid test at home that was negative.

## 2022-02-14 ENCOUNTER — Ambulatory Visit (INDEPENDENT_AMBULATORY_CARE_PROVIDER_SITE_OTHER): Payer: Medicare HMO | Admitting: Advanced Practice Midwife

## 2022-02-14 ENCOUNTER — Other Ambulatory Visit (HOSPITAL_COMMUNITY)
Admission: RE | Admit: 2022-02-14 | Discharge: 2022-02-14 | Disposition: A | Discharge status: Home / Self Care | Payer: Medicare HMO | Source: Ambulatory Visit | Attending: Advanced Practice Midwife | Admitting: Advanced Practice Midwife

## 2022-02-14 ENCOUNTER — Encounter: Payer: Self-pay | Admitting: Advanced Practice Midwife

## 2022-02-14 VITALS — BP 148/102 | HR 90 | Ht 66.0 in | Wt 127.0 lb

## 2022-02-14 DIAGNOSIS — Z1151 Encounter for screening for human papillomavirus (HPV): Secondary | ICD-10-CM | POA: Insufficient documentation

## 2022-02-14 DIAGNOSIS — Z760 Encounter for issue of repeat prescription: Secondary | ICD-10-CM | POA: Diagnosis not present

## 2022-02-14 DIAGNOSIS — A6000 Herpesviral infection of urogenital system, unspecified: Secondary | ICD-10-CM

## 2022-02-14 DIAGNOSIS — N898 Other specified noninflammatory disorders of vagina: Secondary | ICD-10-CM

## 2022-02-14 DIAGNOSIS — Z124 Encounter for screening for malignant neoplasm of cervix: Secondary | ICD-10-CM

## 2022-02-14 DIAGNOSIS — Z01419 Encounter for gynecological examination (general) (routine) without abnormal findings: Secondary | ICD-10-CM | POA: Insufficient documentation

## 2022-02-14 DIAGNOSIS — Z975 Presence of (intrauterine) contraceptive device: Secondary | ICD-10-CM | POA: Diagnosis not present

## 2022-02-14 DIAGNOSIS — R87610 Atypical squamous cells of undetermined significance on cytologic smear of cervix (ASC-US): Secondary | ICD-10-CM

## 2022-02-14 DIAGNOSIS — Z8742 Personal history of other diseases of the female genital tract: Secondary | ICD-10-CM

## 2022-02-14 MED ORDER — AZELASTINE HCL 0.05 % OP SOLN: 1.0000 [drp] | Freq: Two times a day (BID) | OPHTHALMIC | 0 refills | Status: AC

## 2022-02-14 MED ORDER — FEXOFENADINE-PSEUDOEPHED ER 60-120 MG PO TB12: 1.0000 | ORAL_TABLET | Freq: Two times a day (BID) | ORAL | 0 refills | Status: AC

## 2022-02-14 MED ORDER — VALACYCLOVIR HCL 500 MG PO TABS: ORAL_TABLET | ORAL | 11 refills | Status: DC

## 2022-02-14 MED ORDER — FLUTICASONE PROPIONATE 50 MCG/ACT NA SUSP: 1.0000 | Freq: Every day | NASAL | 3 refills | Status: DC

## 2022-02-14 MED ORDER — TRIAMCINOLONE ACETONIDE 0.1 % EX LOTN: 1.0000 | TOPICAL_LOTION | Freq: Two times a day (BID) | CUTANEOUS | 0 refills | Status: AC

## 2022-02-14 MED ORDER — METRONIDAZOLE 500 MG PO TABS: 500.0000 mg | ORAL_TABLET | Freq: Two times a day (BID) | ORAL | 0 refills | Status: DC

## 2022-02-14 NOTE — Progress Notes (Signed)
Annual   LMP: 02/02/2022 -02/07/2022 Monthly  Last Pap: 01/23/21 ASC-US  Contraception: IUD Paragard Needs Medication refills. Pt states PCP office has closed   CC: Possible BV has tried treatment medication and no relief has not been sexually active in 6-7 months . Pt notes she had a bubble to pop on hand hand no previous trauma then disappears and bruises easily pt unsure what may be causing this.   Pt has family Hx of Auto immune Diseases. Notes concerns.

## 2022-02-16 NOTE — Progress Notes (Signed)
GYNECOLOGY PROBLEM CARE ENCOUNTER NOTE  History:     Katrina Harper is a 47 y.o. G61P0020 female here for a gynecologic problem exam.  Current complaints: recurrent foul smelling discharge concerning for Bacterial Vaginosis.  Patient also states her PCP is no longer in business and she needs many of her baseline medications refilled.  Denies abnormal vaginal bleeding, discharge, pelvic pain, problems with intercourse or other gynecologic concerns.    Gynecologic History Patient's last menstrual period was 01/09/2022 (approximate). Contraception: IUD Last Pap: 12/2020. Result was abnormal (ASCUS) with negative HPV Last Mammogram: UTD per patient.  Result was normal   Obstetric History OB History  Gravida Para Term Preterm AB Living  2       2    SAB IAB Ectopic Multiple Live Births    2          # Outcome Date GA Lbr Len/2nd Weight Sex Delivery Anes PTL Lv  2 IAB           1 IAB             Past Medical History:  Diagnosis Date   ADHD    Bipolar 1 disorder (HCC)    Bipolar depression (HCC)    Depression    Herpes     Past Surgical History:  Procedure Laterality Date   INDUCED ABORTION      Current Outpatient Medications on File Prior to Visit  Medication Sig Dispense Refill   amphetamine-dextroamphetamine (ADDERALL) 20 MG tablet Take 20 mg by mouth 4 (four) times daily.     ARIPiprazole (ABILIFY) 30 MG tablet Take 30 mg by mouth daily.     clonazePAM (KLONOPIN) 1 MG tablet Take 1 mg by mouth 3 (three) times daily as needed for anxiety.     guanFACINE (INTUNIV) 2 MG TB24 ER tablet Take 2 mg by mouth every morning.     ibuprofen (ADVIL,MOTRIN) 200 MG tablet Take 200 mg by mouth every 6 (six) hours as needed.     PARAGARD INTRAUTERINE COPPER IU by Intrauterine route.     amoxicillin-clavulanate (AUGMENTIN) 875-125 MG tablet Take 1 tablet by mouth 2 (two) times daily. (Patient not taking: Reported on 02/14/2022) 14 tablet 0   levocetirizine (XYZAL) 5 MG tablet Take 1  tablet (5 mg total) by mouth every evening. (Patient not taking: Reported on 02/14/2022) 90 tablet 0   pseudoephedrine (SUDAFED) 60 MG tablet Take 1 tablet (60 mg total) by mouth every 6 (six) hours as needed for congestion. (Patient not taking: Reported on 02/14/2022) 30 tablet 0   No current facility-administered medications on file prior to visit.    Allergies  Allergen Reactions   Lithium     "makes me toxic"   Ortho Tri-Cyclen [Norgestimate-Eth Estradiol] Other (See Comments)    "aggrivates bipolar"   Symbyax [Olanzapine-Fluoxetine Hcl] Other (See Comments)    "gained 40lb in 30 days"   Lamictal [Lamotrigine] Rash    Social History:  reports that she has been smoking cigars. She has never used smokeless tobacco. She reports current drug use. Frequency: 1.00 time per week. Drug: Marijuana. She reports that she does not drink alcohol.  Family History  Problem Relation Age of Onset   Hypertension Mother    Alcohol abuse Mother    Migraines Mother    Other Mother        cognitive issues started recently   Cancer Father        melanoma on eye   Hypertension Father  Alcohol abuse Father    Arthritis Maternal Grandmother    Mental illness Maternal Grandmother    Alcohol abuse Maternal Grandfather    Migraines Sister    Hypertension Paternal Grandfather     The following portions of the patient's history were reviewed and updated as appropriate: allergies, current medications, past family history, past medical history, past social history, past surgical history and problem list.  Review of Systems Pertinent items noted in HPI and remainder of comprehensive ROS otherwise negative.  Physical Exam:  BP (!) 148/102   Pulse 90   Ht 5\' 6"  (1.676 m)   Wt 127 lb (57.6 kg)   LMP 01/09/2022 (Approximate)   BMI 20.50 kg/m  CONSTITUTIONAL: Well-developed, well-nourished female in no acute distress.  HENT:  Normocephalic, atraumatic, External right and left ear normal.  EYES:  Conjunctivae and EOM are normal. Pupils are equal, round, and reactive to light. No scleral icterus.  SKIN: Skin is warm and dry. No rash noted. Not diaphoretic. No erythema. No pallor. MUSCULOSKELETAL: Normal range of motion. No tenderness.  No cyanosis, clubbing, or edema. NEUROLOGIC: Alert and oriented to person, place, and time. Normal reflexes, muscle tone coordination.  PSYCHIATRIC: Normal mood and affect. Normal behavior. Normal judgment and thought content. CARDIOVASCULAR: Normal heart rate noted, regular rhythm RESPIRATORY: Clear to auscultation bilaterally. Effort and breath sounds normal, no problems with respiration noted. BREASTS: Deferred ABDOMEN: Soft, no distention noted.  No tenderness, rebound or guarding.  PELVIC: Normal appearing external genitalia and urethral meatus; normal appearing vaginal mucosa and cervix.  Thin white discharge throughout fornix c/w BV. IUD strings visualized.  Pap smear obtained.   Performed in the presence of a chaperone.   Assessment and Plan:    1. Screening for cervical cancer - Cytology - PAP  2. Vaginal discharge  - Cervicovaginal ancillary only( [place]) - metroNIDAZOLE (FLAGYL) 500 MG tablet; Take 1 tablet (500 mg total) by mouth 2 (two) times daily.  Dispense: 14 tablet; Refill: 0  3. IUD contraception - satisfied with method - Paragard placed 01/23/2021  4. Encounter for medication refill -Needs new PCP - fluticasone (FLONASE) 50 MCG/ACT nasal spray; Place 1 spray into both nostrils daily.  Dispense: 15.8 mL; Refill: 3 - azelastine (OPTIVAR) 0.05 % ophthalmic solution; Apply 1 drop to eye 2 (two) times daily.  Dispense: 6 mL; Refill: 0 - fexofenadine-pseudoephedrine (ALLEGRA-D) 60-120 MG 12 hr tablet; Take 1 tablet by mouth 2 (two) times daily.  Dispense: 60 tablet; Refill: 0 - triamcinolone lotion (KENALOG) 0.1 %; Apply 1 Application topically 2 (two) times daily.  Dispense: 60 mL; Refill: 0  5. Herpes simplex infection of  genitourinary system - No active lesions - valACYclovir (VALTREX) 500 MG tablet; 1 tab po qday. Take 1 tab po bid x 3 days for an outbreak  Dispense: 90 tablet; Refill: 11  6. ASCUS of cervix with negative high risk HPV - ASCUS with Negative HRHPV 01/23/2021 - Confirmed pap not due, will collect per patient request "for reassurance" - Pap collected per patient request  Will follow up results of pap smear and manage accordingly. Routine preventative health maintenance measures emphasized. Please refer to After Visit Summary for other counseling recommendations.     01/25/2021, MSA, MSN, CNM Certified Nurse Midwife, [place] [employer] for [employer]ist, clinical, [facility]

## 2022-02-19 LAB — CERVICOVAGINAL ANCILLARY ONLY
Bacterial Vaginitis (gardnerella): POSITIVE — AB
Candida Glabrata: NEGATIVE
Candida Vaginitis: NEGATIVE
Comment: NEGATIVE
Comment: NEGATIVE
Comment: NEGATIVE

## 2022-02-20 LAB — CYTOLOGY - PAP
Comment: NEGATIVE
Diagnosis: NEGATIVE
High risk HPV: NEGATIVE

## 2022-02-28 ENCOUNTER — Ambulatory Visit: Payer: Medicare HMO | Admitting: Advanced Practice Midwife

## 2022-03-12 ENCOUNTER — Other Ambulatory Visit: Payer: Self-pay | Admitting: *Deleted

## 2022-03-12 MED ORDER — CLINDAMYCIN HCL 300 MG PO CAPS: 300.0000 mg | ORAL_CAPSULE | Freq: Two times a day (BID) | ORAL | 0 refills | Status: AC

## 2022-07-11 ENCOUNTER — Other Ambulatory Visit (HOSPITAL_COMMUNITY)
Admission: RE | Admit: 2022-07-11 | Discharge: 2022-07-11 | Disposition: A | Discharge status: Home / Self Care | Payer: Medicare HMO | Source: Ambulatory Visit | Attending: Obstetrics and Gynecology | Admitting: Obstetrics and Gynecology

## 2022-07-11 ENCOUNTER — Encounter: Payer: Self-pay | Admitting: Obstetrics and Gynecology

## 2022-07-11 ENCOUNTER — Ambulatory Visit (INDEPENDENT_AMBULATORY_CARE_PROVIDER_SITE_OTHER): Payer: Medicare HMO | Admitting: Obstetrics and Gynecology

## 2022-07-11 VITALS — BP 120/71 | HR 84 | Wt 137.0 lb

## 2022-07-11 DIAGNOSIS — N941 Unspecified dyspareunia: Secondary | ICD-10-CM

## 2022-07-11 DIAGNOSIS — N898 Other specified noninflammatory disorders of vagina: Secondary | ICD-10-CM | POA: Insufficient documentation

## 2022-07-11 DIAGNOSIS — L309 Dermatitis, unspecified: Secondary | ICD-10-CM

## 2022-07-11 MED ORDER — TRIAMCINOLONE ACETONIDE 0.1 % EX OINT: 1.0000 | TOPICAL_OINTMENT | Freq: Two times a day (BID) | CUTANEOUS | 0 refills | Status: DC | PRN

## 2022-07-11 NOTE — Progress Notes (Addendum)
Obstetrics and Gynecology Visit Return Patient Evaluation  Appointment Date: 07/11/2022  Primary Care Provider: [facility]: Center for [facility]  Chief Complaint: dyspareunia  History of Present Illness:  Katrina Harper is a 47 y.o. P0 with above CC. Patient had negative swab and pap in mid November 2023 and no s/s but she wasn't sexually active. She has started being sexually active with a new partner since last visit. They have see each other about once a week and the first day is fine but the next day, she has a "ring of fire" in there lower belly area. No vaginal dryness, OTC lubricants, HSV s/s. No issues with the paragard that she has had for several years. Last sex about a week ago  Patient having difficulty getting kenalog from PCP and asking about some for eczema  Review of Systems: s noted in the History of Present Illness.  Medications:  [name] had no medications administered during this visit. Current Outpatient Medications  Medication Sig Dispense Refill   amphetamine-dextroamphetamine (ADDERALL) 20 MG tablet Take 20 mg by mouth 4 (four) times daily.     ARIPiprazole (ABILIFY) 30 MG tablet Take 30 mg by mouth daily.     clonazePAM (KLONOPIN) 1 MG tablet Take 1 mg by mouth 3 (three) times daily as needed for anxiety.     guanFACINE (INTUNIV) 2 MG TB24 ER tablet Take 2 mg by mouth every morning.     PARAGARD INTRAUTERINE COPPER IU by Intrauterine route.     triamcinolone ointment (KENALOG) 0.1 % Apply 1 Application topically 2 (two) times daily as needed. 30 g 0   valACYclovir (VALTREX) 500 MG tablet 1 tab po qday. Take 1 tab po bid x 3 days for an outbreak 90 tablet 11   fluticasone (FLONASE) 50 MCG/ACT nasal spray Place 1 spray into both nostrils daily. 15.8 mL 3   ibuprofen (ADVIL,MOTRIN) 200 MG tablet Take 200 mg by mouth every 6 (six) hours as needed.     levocetirizine (XYZAL) 5 MG tablet Take 1 tablet (5 mg total) by  mouth every evening. (Patient not taking: Reported on 02/14/2022) 90 tablet 0   metroNIDAZOLE (FLAGYL) 500 MG tablet Take 1 tablet (500 mg total) by mouth 2 (two) times daily. 14 tablet 0   pseudoephedrine (SUDAFED) 60 MG tablet Take 1 tablet (60 mg total) by mouth every 6 (six) hours as needed for congestion. (Patient not taking: Reported on 02/14/2022) 30 tablet 0   No current facility-administered medications for this visit.    Allergies: is allergic to lithium, ortho tri-cyclen [norgestimate-eth estradiol], symbyax [olanzapine-fluoxetine hcl], and lamictal [lamotrigine].  Physical Exam:  BP 120/71   Pulse 84   Wt 137 lb (62.1 kg)   BMI 22.11 kg/m  Body mass index is 22.11 kg/m. General appearance: Well nourished, well developed female in no acute distress.  Abdomen: diffusely non tender to palpation, non distended, and no masses, hernias Skin: mild eczema on elbows and low back Neuro/Psych:  Normal mood and affect.    Pelvic exam:  Cervical exam performed in the presence of a chaperone EGBUS: normal Vaginal vault: scant yellowish d/c, spot of dried old blood Cervix: nttp, IUD strings normal, 3cm. Cervix is only about 5.5cm from the introitus Bimanual: negative.    Assessment: patient stable  Plan:  1. Dyspareunia in female Unclear etiology but I wonder if could be from cervical pain on the day before. I told her I recommend avoiding positions that cause  deeper penetration and she is amenable to trial of pelvic floor PT - Ambulatory referral to Physical Therapy - Cervicovaginal ancillary only( [place])  2. Vaginal discharge F/u swab - Cervicovaginal ancillary only( [place])  3. Eczema Kenalog sent in  RTC: PRN  Cornelia Copa MD Attending Center for [facility])

## 2022-07-11 NOTE — Progress Notes (Signed)
Has a new partner, intercourse causing "a ring of fire" pain.  Pt states has no issues natural lubrication   Not sure if she has BV again  Asking also for some steroid ointment for her eczema

## 2022-07-12 LAB — CERVICOVAGINAL ANCILLARY ONLY
Bacterial Vaginitis (gardnerella): POSITIVE — AB
Candida Glabrata: NEGATIVE
Candida Vaginitis: NEGATIVE
Chlamydia: NEGATIVE
Comment: NEGATIVE
Comment: NEGATIVE
Comment: NEGATIVE
Comment: NEGATIVE
Comment: NEGATIVE
Comment: NORMAL
Neisseria Gonorrhea: NEGATIVE
Trichomonas: NEGATIVE

## 2022-07-15 MED ORDER — METRONIDAZOLE 500 MG PO TABS: 500.0000 mg | ORAL_TABLET | Freq: Two times a day (BID) | ORAL | 0 refills | Status: DC

## 2022-07-15 NOTE — Addendum Note (Signed)
Addended by:  Bing on: 07/15/2022 08:25 AM   Modules accepted: Orders

## 2022-07-16 ENCOUNTER — Other Ambulatory Visit: Payer: Self-pay | Admitting: *Deleted

## 2022-07-16 DIAGNOSIS — Z760 Encounter for issue of repeat prescription: Secondary | ICD-10-CM

## 2022-07-16 MED ORDER — FLUTICASONE PROPIONATE 50 MCG/ACT NA SUSP
1.0000 | Freq: Every day | NASAL | 3 refills | Status: DC
Start: 2022-07-16 — End: 2022-11-12

## 2022-10-14 ENCOUNTER — Other Ambulatory Visit (HOSPITAL_COMMUNITY)
Admission: RE | Admit: 2022-10-14 | Discharge: 2022-10-14 | Disposition: A | Discharge status: Home / Self Care | Payer: Medicare HMO | Source: Ambulatory Visit | Attending: Obstetrics & Gynecology | Admitting: Obstetrics & Gynecology

## 2022-10-14 ENCOUNTER — Ambulatory Visit (INDEPENDENT_AMBULATORY_CARE_PROVIDER_SITE_OTHER): Payer: Medicare HMO | Admitting: Obstetrics & Gynecology

## 2022-10-14 ENCOUNTER — Encounter: Payer: Self-pay | Admitting: Obstetrics & Gynecology

## 2022-10-14 VITALS — BP 131/80 | HR 112 | Wt 143.0 lb

## 2022-10-14 DIAGNOSIS — N751 Abscess of Bartholin's gland: Secondary | ICD-10-CM

## 2022-10-14 DIAGNOSIS — N760 Acute vaginitis: Secondary | ICD-10-CM | POA: Diagnosis not present

## 2022-10-14 MED ORDER — SULFAMETHOXAZOLE-TRIMETHOPRIM 800-160 MG PO TABS
1.0000 | ORAL_TABLET | Freq: Two times a day (BID) | ORAL | 1 refills | Status: DC
Start: 2022-10-14 — End: 2022-11-21

## 2022-10-14 MED ORDER — CLINDAMYCIN HCL 300 MG PO CAPS
300.0000 mg | ORAL_CAPSULE | Freq: Two times a day (BID) | ORAL | 0 refills | Status: AC
Start: 2022-10-14 — End: 2022-10-21

## 2022-10-14 NOTE — Progress Notes (Signed)
GYNECOLOGY OFFICE VISIT NOTE  History:   Katrina Harper is a 47 y.o. G2P0020 here today for report of lump on left outer labia 3 days ago and has been getting bigger and more painful.  Also reports having abnormal irritating vaginal discharge, had recent period, thinks she has BV. She denies any abnormal vaginal discharge, bleeding, pelvic pain or other concerns.    Past Medical History:  Diagnosis Date   ADHD    Bipolar 1 disorder (HCC)    Bipolar depression (HCC)    Depression    Herpes     Past Surgical History:  Procedure Laterality Date   INDUCED ABORTION      The following portions of the patient's history were reviewed and updated as appropriate: allergies, current medications, past family history, past medical history, past social history, past surgical history and problem list.   Health Maintenance:  Normal pap and negative HRHPV on 02/14/2022.    Review of Systems:  Pertinent items noted in HPI and remainder of comprehensive ROS otherwise negative.  Physical Exam:  BP 131/80   Pulse (!) 112   Wt 143 lb (64.9 kg)   BMI 23.08 kg/m  CONSTITUTIONAL: Well-developed, well-nourished female in no acute distress.  HEENT:  Normocephalic, atraumatic. External right and left ear normal. No scleral icterus.  NECK: Normal range of motion, supple, no masses noted on observation SKIN: No rash noted. Not diaphoretic. No erythema. No pallor. MUSCULOSKELETAL: Normal range of motion. No edema noted. NEUROLOGIC: Alert and oriented to person, place, and time. Normal muscle tone coordination. No cranial nerve deficit noted. PSYCHIATRIC: Normal mood and affect. Normal behavior. Normal judgment and thought content. CARDIOVASCULAR: Normal heart rate noted RESPIRATORY: Effort and breath sounds normal, no problems with respiration noted ABDOMEN: No masses noted. No other overt distention noted.   PELVIC: Normal appearing external genitalia;  enlarged and tender left Bartholin's gland  abscess about 3-4 cm in size.  Brown discharge noted, testing sample obtained. Performed in the presence of a chaperone.  Bartholin Cyst I&D and Word Catheter Placement Patient informed about diagnosis, intervention recommended.  Enlarged abscess palpated in front of the hymenal ring around 5 o' clock.  Written informed consent was obtained.  Discussed complications and possible outcomes of procedure including recurrence of cyst, scarring leading to infection, bleeding, dyspareunia, distortion of anatomy.  Patient was examined in the dorsal lithotomy position and mass was identified.  The area was prepped with Iodine and draped in a sterile manner. 1% Lidocaine (3 ml) was then used to infiltrate area on top of the cyst, behind the hymenal ring.  A 7 mm incision was made using a sterile scapel. Upon palpation of the mass, a moderate amount of bloody, malodorous, purulent drainage was expressed through the incision. A hemostat was used to break up loculations, which resulted in expression of more bloody purulent drainage. The open cyst was then copiously irrigated with normal saline, and a Word catheter was placed. 1.5 ml of sterile water was used to inflate the catheter balloon.  The end of the catheter was tucked into the vagina.  Patient tolerated the procedure well, reported feeling " a lot better."     Assessment and Plan:     1. Acute vaginitis - Cervicovaginal ancillary only done - clindamycin (CLEOCIN) 300 MG capsule; Take 1 capsule (300 mg total) by mouth 2 (two) times daily for 7 days.  Dispense: 14 capsule; Refill: 0. This was prescribed for possible BV, will also help with Bartholin's abscess.  2. Bartholin's gland abscess Status post I&D and Word catheter placement - sulfamethoxazole-trimethoprim (BACTRIM DS) 800-160 MG tablet; Take 1 tablet by mouth 2 (two) times daily.  Dispense: 14 tablet; Refill: 1 - clindamycin (CLEOCIN) 300 MG capsule; Take 1 capsule (300 mg total) by mouth 2 (two)  times daily for 7 days.  Dispense: 14 capsule; Refill: 0 - Recommended Sitz baths bid and NSAIDs  prn pain.   She was told to call to be examined if she experiences increasing swelling, pain, vaginal discharge, or fever.  - She was instructed to wear a peripad to absorb discharge, and to maintain pelvic rest while the Word catheter is in place.  -The catheter will be left in place for at least four weeks to promote formation of an epithelialized tract for permanent drainage of glandular secretions.   Routine preventative health maintenance measures emphasized. Please refer to After Visit Summary for other counseling recommendations.   Return in about 2 weeks (around 10/28/2022) for Bartholin's abscess followup.    I spent 30 minutes dedicated to the care of this patient including pre-visit review of records, face to face time with the patient discussing her conditions and treatments and post visit orders.    Jaynie Collins, MD, FACOG Obstetrician & Gynecologist, [facility] for [employer], [facility]

## 2022-10-15 LAB — CERVICOVAGINAL ANCILLARY ONLY
Bacterial Vaginitis (gardnerella): POSITIVE — AB
Candida Glabrata: NEGATIVE
Candida Vaginitis: NEGATIVE
Chlamydia: NEGATIVE
Comment: NEGATIVE
Comment: NEGATIVE
Comment: NEGATIVE
Comment: NEGATIVE
Comment: NEGATIVE
Comment: NORMAL
Neisseria Gonorrhea: NEGATIVE
Trichomonas: NEGATIVE

## 2022-10-21 NOTE — Telephone Encounter (Signed)
TC from pt regarding recent drainage of Barth Cyst Pt sill notes drainge no odor Almost done with antibiotics and Rx for BV Pt advised her sx's sound expected yet I will consult with provider this afternoon. Pt agreeable and voiced understanding.

## 2022-10-24 ENCOUNTER — Other Ambulatory Visit: Payer: Self-pay | Admitting: Obstetrics & Gynecology

## 2022-10-24 DIAGNOSIS — N751 Abscess of Bartholin's gland: Secondary | ICD-10-CM

## 2022-10-29 ENCOUNTER — Ambulatory Visit (INDEPENDENT_AMBULATORY_CARE_PROVIDER_SITE_OTHER): Payer: Medicare HMO | Admitting: Obstetrics & Gynecology

## 2022-10-29 ENCOUNTER — Encounter: Payer: Self-pay | Admitting: Obstetrics & Gynecology

## 2022-10-29 VITALS — BP 106/73 | HR 84 | Wt 147.0 lb

## 2022-10-29 DIAGNOSIS — N751 Abscess of Bartholin's gland: Secondary | ICD-10-CM

## 2022-10-29 DIAGNOSIS — Z1231 Encounter for screening mammogram for malignant neoplasm of breast: Secondary | ICD-10-CM

## 2022-10-29 NOTE — Progress Notes (Signed)
   GYNECOLOGY OFFICE VISIT NOTE  History:   Katrina Harper is a 47 y.o. G2P0020 here today for follow up of left Bartholin's gland abscess that was incised and drained on 10/14/22 and Word Catheter was placed.  Reports catheter fell out 2 days ago. Otherwise, feels much better. Took antibiotics as prescribed.  She denies any abnormal vaginal discharge, bleeding, pelvic pain or other concerns.    Past Medical History:  Diagnosis Date   ADHD    Bipolar 1 disorder (HCC)    Bipolar depression (HCC)    Depression    Herpes     Past Surgical History:  Procedure Laterality Date   INDUCED ABORTION      The following portions of the patient's history were reviewed and updated as appropriate: allergies, current medications, past family history, past medical history, past social history, past surgical history and problem list.   Health Maintenance:  Normal pap and negative HRHPV on 02/14/2022.    Review of Systems:  Pertinent items noted in HPI and remainder of comprehensive ROS otherwise negative.  Physical Exam:  BP 106/73   Pulse 84   Wt 147 lb (66.7 kg)   BMI 23.73 kg/m  CONSTITUTIONAL: Well-developed, well-nourished female in no acute distress.  MUSCULOSKELETAL: Normal range of motion. No edema noted. NEUROLOGIC: Alert and oriented to person, place, and time. Normal muscle tone coordination. No cranial nerve deficit noted. PSYCHIATRIC: Normal mood and affect. Normal behavior. Normal judgment and thought content. CARDIOVASCULAR: Normal heart rate noted RESPIRATORY: Effort and breath sounds normal, no problems with respiration noted ABDOMEN: No masses noted. No other overt distention noted.   PELVIC: Normal appearing external genitalia; normal urethral meatus; normal appearing distal vaginal mucosa. Healed open incision over left Bartholin's gland, no current drainage. No erythema, no tenderness.  No abnormal discharge noted.   Performed in the presence of a chaperone     Assessment  and Plan:    1. Bartholin's gland abscess on the left Well-healing, epithelialized incision on the Bartholin's gland, resolved abscess. Patient told to continue proper vulvar hygiene, let us know if symptoms recur.  Routine preventative health maintenance measures emphasized, screening mammogram ordered for patient.   Return for any gynecologic concerns.    I spent 20 minutes dedicated to the care of this patient including pre-visit review of records, face to face time with the patient discussing her conditions and treatments and post visit orders.    Jaynie Collins, MD, FACOG Obstetrician & Gynecologist, [facility] for [employer], [facility]

## 2022-11-05 ENCOUNTER — Ambulatory Visit: Payer: Medicare HMO | Admitting: Obstetrics & Gynecology

## 2022-11-12 ENCOUNTER — Other Ambulatory Visit: Payer: Self-pay | Admitting: *Deleted

## 2022-11-12 DIAGNOSIS — Z760 Encounter for issue of repeat prescription: Secondary | ICD-10-CM

## 2022-11-12 MED ORDER — FLUTICASONE PROPIONATE 50 MCG/ACT NA SUSP
1.0000 | Freq: Every day | NASAL | 3 refills | Status: DC
Start: 2022-11-12 — End: 2023-03-18

## 2022-11-21 ENCOUNTER — Other Ambulatory Visit: Payer: Self-pay | Admitting: *Deleted

## 2022-11-21 DIAGNOSIS — N751 Abscess of Bartholin's gland: Secondary | ICD-10-CM

## 2022-11-21 MED ORDER — SULFAMETHOXAZOLE-TRIMETHOPRIM 800-160 MG PO TABS
1.0000 | ORAL_TABLET | Freq: Two times a day (BID) | ORAL | 1 refills | Status: DC
Start: 2022-11-21 — End: 2022-12-17

## 2022-12-05 ENCOUNTER — Other Ambulatory Visit: Payer: Self-pay | Admitting: *Deleted

## 2022-12-05 DIAGNOSIS — Z760 Encounter for issue of repeat prescription: Secondary | ICD-10-CM

## 2022-12-05 MED ORDER — VALACYCLOVIR HCL 500 MG PO TABS
ORAL_TABLET | ORAL | 11 refills | Status: DC
Start: 2022-12-05 — End: 2023-03-10

## 2022-12-17 ENCOUNTER — Encounter: Payer: Self-pay | Admitting: Obstetrics and Gynecology

## 2022-12-17 ENCOUNTER — Other Ambulatory Visit: Payer: Medicare HMO

## 2022-12-17 ENCOUNTER — Other Ambulatory Visit: Payer: Self-pay | Admitting: Obstetrics and Gynecology

## 2022-12-17 ENCOUNTER — Ambulatory Visit (INDEPENDENT_AMBULATORY_CARE_PROVIDER_SITE_OTHER): Payer: Medicare HMO | Admitting: Obstetrics and Gynecology

## 2022-12-17 DIAGNOSIS — N751 Abscess of Bartholin's gland: Secondary | ICD-10-CM | POA: Diagnosis not present

## 2022-12-17 DIAGNOSIS — E785 Hyperlipidemia, unspecified: Secondary | ICD-10-CM

## 2022-12-17 DIAGNOSIS — Z130 Encounter for screening for diseases of the blood and blood-forming organs and certain disorders involving the immune mechanism: Secondary | ICD-10-CM

## 2022-12-17 MED ORDER — SULFAMETHOXAZOLE-TRIMETHOPRIM 800-160 MG PO TABS
1.0000 | ORAL_TABLET | Freq: Two times a day (BID) | ORAL | 1 refills | Status: DC
Start: 2022-12-17 — End: 2023-02-07

## 2022-12-17 NOTE — Progress Notes (Signed)
Follow up.

## 2022-12-17 NOTE — Progress Notes (Signed)
Pt here for Labs requested by her Psych Provider, Pt's results will be sent to Psych provider once results have been received

## 2022-12-17 NOTE — Progress Notes (Signed)
   GYNECOLOGY OFFICE VISIT NOTE  History:   Katrina Harper is a 47 y.o. G2P0020 here today for follow up of her Bartholin's cyst. She is s/p Word catheter. She feels a small bump in that area and wanted to be checked.   She denies any abnormal vaginal discharge, bleeding, pelvic pain or other concerns.     Past Medical History:  Diagnosis Date   ADHD    Bipolar 1 disorder (HCC)    Bipolar depression (HCC)    Depression    Herpes     Past Surgical History:  Procedure Laterality Date   INDUCED ABORTION      The following portions of the patient's history were reviewed and updated as appropriate: allergies, current medications, past family history, past medical history, past social history, past surgical history and problem list.   Health Maintenance:   Diagnosis  Date Value Ref Range Status  02/14/2022   Final   - Negative for intraepithelial lesion or malignancy (NILM)    Review of Systems:  Pertinent items noted in HPI and remainder of comprehensive ROS otherwise negative.  Physical Exam:  There were no vitals taken for this visit. CONSTITUTIONAL: Well-developed, well-nourished female in no acute distress.  HEENT:  Normocephalic, atraumatic. External right and left ear normal. No scleral icterus.  NECK: Normal range of motion, supple, no masses noted on observation SKIN: No rash noted. Not diaphoretic. No erythema. No pallor. MUSCULOSKELETAL: Normal range of motion. No edema noted. NEUROLOGIC: Alert and oriented to person, place, and time. Normal muscle tone coordination. No cranial nerve deficit noted. PSYCHIATRIC: Normal mood and affect. Normal behavior. Normal judgment and thought content.  PELVIC: Normal appearing external genitalia; normal urethral meatus; normal appearing vaginal mucosa with small opening where prior word catheter was open. Some surrouding scar tissue from where catheter was but path to bartholin's is open/patent and no evidence of abscess.   Performed in the presence of a chaperone  Labs and Imaging No results found for this or any previous visit (from the past 168 hour(s)). No results found.  Assessment and Plan:   1. Bartholin's gland abscess on the left Reassurance given that she has healed well. She would like bactrim on hand in case it flares, she can take it. Rx will be sent.  - sulfamethoxazole-trimethoprim (BACTRIM DS) 800-160 MG tablet; Take 1 tablet by mouth 2 (two) times daily.  Dispense: 14 tablet; Refill: 1     Meds ordered this encounter  Medications   sulfamethoxazole-trimethoprim (BACTRIM DS) 800-160 MG tablet    Sig: Take 1 tablet by mouth 2 (two) times daily.    Dispense:  14 tablet    Refill:  1     Routine preventative health maintenance measures emphasized. Please refer to After Visit Summary for other counseling recommendations.   No follow-ups on file.  Milas Hock, MD, FACOG Obstetrician & Gynecologist, [facility] for [facility], [facility]

## 2022-12-18 ENCOUNTER — Telehealth: Payer: Self-pay | Admitting: Obstetrics and Gynecology

## 2022-12-18 NOTE — Telephone Encounter (Signed)
Pt's lab results were requested and to faxed to Dr.Kaur.   Per media note on 12/05/22   Pt aware

## 2023-01-16 ENCOUNTER — Encounter: Payer: Self-pay | Admitting: Obstetrics and Gynecology

## 2023-01-16 ENCOUNTER — Other Ambulatory Visit: Payer: Self-pay | Admitting: Obstetrics and Gynecology

## 2023-01-17 ENCOUNTER — Other Ambulatory Visit: Payer: Self-pay | Admitting: *Deleted

## 2023-01-17 MED ORDER — METRONIDAZOLE 500 MG PO TABS
500.0000 mg | ORAL_TABLET | Freq: Two times a day (BID) | ORAL | 0 refills | Status: AC
Start: 2023-01-17 — End: 2023-01-24

## 2023-01-17 MED ORDER — TRIAMCINOLONE ACETONIDE 0.1 % EX OINT: 1.0000 | TOPICAL_OINTMENT | Freq: Two times a day (BID) | CUTANEOUS | 0 refills | Status: DC | PRN

## 2023-01-20 ENCOUNTER — Other Ambulatory Visit: Payer: Self-pay | Admitting: *Deleted

## 2023-01-20 MED ORDER — FLUCONAZOLE 150 MG PO TABS
150.0000 mg | ORAL_TABLET | Freq: Once | ORAL | 3 refills | Status: AC
Start: 2023-01-20 — End: 2023-01-20

## 2023-01-24 ENCOUNTER — Encounter: Payer: Self-pay | Admitting: Obstetrics and Gynecology

## 2023-02-07 ENCOUNTER — Other Ambulatory Visit (HOSPITAL_COMMUNITY)
Admission: RE | Admit: 2023-02-07 | Discharge: 2023-02-07 | Disposition: A | Discharge status: Home / Self Care | Payer: Medicare HMO | Source: Ambulatory Visit | Attending: Adult Health | Admitting: Adult Health

## 2023-02-07 ENCOUNTER — Ambulatory Visit: Payer: Medicare HMO | Admitting: Family Medicine

## 2023-02-07 ENCOUNTER — Encounter: Payer: Self-pay | Admitting: Family Medicine

## 2023-02-07 VITALS — BP 126/90 | HR 89 | Wt 146.0 lb

## 2023-02-07 DIAGNOSIS — Z1151 Encounter for screening for human papillomavirus (HPV): Secondary | ICD-10-CM | POA: Insufficient documentation

## 2023-02-07 DIAGNOSIS — Z01419 Encounter for gynecological examination (general) (routine) without abnormal findings: Secondary | ICD-10-CM | POA: Insufficient documentation

## 2023-02-07 DIAGNOSIS — Z30430 Encounter for insertion of intrauterine contraceptive device: Secondary | ICD-10-CM

## 2023-02-07 DIAGNOSIS — Z3202 Encounter for pregnancy test, result negative: Secondary | ICD-10-CM

## 2023-02-07 DIAGNOSIS — Z124 Encounter for screening for malignant neoplasm of cervix: Secondary | ICD-10-CM

## 2023-02-07 LAB — POCT URINE PREGNANCY: Preg Test, Ur: NEGATIVE

## 2023-02-07 MED ORDER — LEVONORGESTREL 20 MCG/DAY IU IUD
1.0000 | INTRAUTERINE_SYSTEM | Freq: Once | INTRAUTERINE | Status: AC
  Administered 2023-02-07: 1 via INTRAUTERINE

## 2023-02-07 NOTE — Progress Notes (Signed)
RGYN patient here for IUD insertion pt sent message on 01/24/23 that IUD fell out while in the shower.   LMP: 2 wks ago per pt unsure of date denies any unprotected intercourse x 14 days.

## 2023-02-07 NOTE — Progress Notes (Signed)
   GYNECOLOGY PROBLEM  VISIT ENCOUNTER NOTE  Subjective:   Katrina Harper is a 47 y.o. G25P0020 female here for a problem GYN visit.  Current complaints: IUD fell out/expulsed at home. Reports she likes the IUD and wants another. This is her 2nd expulsion. Last IUD was placed 3 yrs prior and came out during her period. She has a CuIUD.   She reports she was told because of age, smoking to avoid estrogen and I reviewed that LNG IUD does not have estrogen and can help her period.   Denies abnormal vaginal bleeding, discharge, pelvic pain, problems with intercourse or other gynecologic concerns.    Gynecologic History Patient's last menstrual period was 01/20/2023 (approximate).  Contraception: IUD  Health Maintenance Due  Topic Date Due   Medicare Annual Wellness (AWV)  Never done   Hepatitis C Screening  Never done   DTaP/Tdap/Td (1 - Tdap) Never done   INFLUENZA VACCINE  Never done   COVID-19 Vaccine (3 - 2023-24 season) 12/01/2022    The following portions of the patient's history were reviewed and updated as appropriate: allergies, current medications, past family history, past medical history, past social history, past surgical history and problem list.  Review of Systems Pertinent items are noted in HPI.   Objective:  BP (!) 126/90   Pulse 89   Wt 146 lb (66.2 kg)   LMP 01/20/2023 (Approximate)   BMI 23.57 kg/m  Gen: well appearing, NAD HEENT: no scleral icterus CV: RR Lung: Normal WOB Ext: warm well perfused  PELVIC: Normal appearing external genitalia; normal appearing vaginal mucosa and cervix.  No abnormal discharge noted.  Pap smear obtained.  Normal uterine size, no other palpable masses, no uterine or adnexal tenderness.  IUD Insertion Procedure Note Patient identified, informed consent performed, consent signed.   Discussed risks of irregular bleeding, cramping, infection, malpositioning or misplacement of the IUD outside the uterus which may require further  procedure such as laparoscopy. Time out was performed.  Urine pregnancy test negative.  Speculum placed in the vagina.  Cervix visualized.  Cleaned with Betadine x 2.  Grasped anteriorly with a single tooth tenaculum.  Uterus sounded to 8 cm.  IUD placed per manufacturer's recommendations.  Strings trimmed to 3 cm. Tenaculum was removed, good hemostasis noted.  Patient tolerated procedure well.    Assessment and Plan:  1. Cervical cancer screening - POCT urine pregnancy - Cytology - PAP  2. Encounter for IUD insertion Patient was given post-procedure instructions.  She was advised to have backup contraception for one week.  Patient was also asked to check IUD strings periodically and follow up in 4 weeks for IUD check. - levonorgestrel (MIRENA) 20 MCG/DAY IUD 1 each   Please refer to After Visit Summary for other counseling recommendations.   No follow-ups on file.  Federico Flake, MD, MPH, ABFM Attending Physician Faculty Practice- Center for [facility]

## 2023-02-10 ENCOUNTER — Other Ambulatory Visit: Payer: Self-pay | Admitting: *Deleted

## 2023-02-10 MED ORDER — IBUPROFEN 600 MG PO TABS: 600.0000 mg | ORAL_TABLET | Freq: Four times a day (QID) | ORAL | 1 refills | Status: DC | PRN

## 2023-02-12 LAB — CYTOLOGY - PAP
Comment: NEGATIVE
Diagnosis: NEGATIVE
High risk HPV: NEGATIVE

## 2023-02-13 ENCOUNTER — Other Ambulatory Visit: Payer: Self-pay | Admitting: *Deleted

## 2023-02-13 MED ORDER — ACETAMINOPHEN ER 650 MG PO TBCR: 650.0000 mg | EXTENDED_RELEASE_TABLET | Freq: Three times a day (TID) | ORAL | 2 refills | Status: DC | PRN

## 2023-02-28 ENCOUNTER — Encounter: Payer: Self-pay | Admitting: Family Medicine

## 2023-03-03 ENCOUNTER — Other Ambulatory Visit: Payer: Self-pay | Admitting: *Deleted

## 2023-03-03 MED ORDER — METRONIDAZOLE 500 MG PO TABS: 500.0000 mg | ORAL_TABLET | Freq: Two times a day (BID) | ORAL | 0 refills | Status: AC

## 2023-03-03 MED ORDER — FLUCONAZOLE 150 MG PO TABS: 150.0000 mg | ORAL_TABLET | Freq: Once | ORAL | 3 refills | Status: AC

## 2023-03-09 ENCOUNTER — Encounter: Payer: Self-pay | Admitting: Family Medicine

## 2023-03-09 ENCOUNTER — Other Ambulatory Visit: Payer: Self-pay | Admitting: Obstetrics and Gynecology

## 2023-03-10 ENCOUNTER — Other Ambulatory Visit: Payer: Self-pay | Admitting: *Deleted

## 2023-03-10 DIAGNOSIS — Z760 Encounter for issue of repeat prescription: Secondary | ICD-10-CM

## 2023-03-10 MED ORDER — TRIAMCINOLONE ACETONIDE 0.1 % EX OINT: 1.0000 | TOPICAL_OINTMENT | Freq: Two times a day (BID) | CUTANEOUS | 0 refills | Status: DC | PRN

## 2023-03-10 MED ORDER — VALACYCLOVIR HCL 500 MG PO TABS: ORAL_TABLET | ORAL | 11 refills | Status: DC

## 2023-03-13 ENCOUNTER — Other Ambulatory Visit: Payer: Self-pay | Admitting: Obstetrics & Gynecology

## 2023-03-13 DIAGNOSIS — Z760 Encounter for issue of repeat prescription: Secondary | ICD-10-CM

## 2023-03-14 ENCOUNTER — Other Ambulatory Visit: Payer: Self-pay | Admitting: Obstetrics and Gynecology

## 2023-03-14 ENCOUNTER — Encounter: Payer: Self-pay | Admitting: Obstetrics and Gynecology

## 2023-03-14 ENCOUNTER — Other Ambulatory Visit: Payer: Self-pay | Admitting: *Deleted

## 2023-03-14 DIAGNOSIS — N751 Abscess of Bartholin's gland: Secondary | ICD-10-CM

## 2023-03-14 MED ORDER — SULFAMETHOXAZOLE-TRIMETHOPRIM 800-160 MG PO TABS: 1.0000 | ORAL_TABLET | Freq: Two times a day (BID) | ORAL | 1 refills | Status: DC

## 2023-03-14 NOTE — Progress Notes (Signed)
Sending to preferred pharmacy

## 2023-04-12 ENCOUNTER — Other Ambulatory Visit: Payer: Self-pay | Admitting: Obstetrics and Gynecology

## 2023-04-12 DIAGNOSIS — N751 Abscess of Bartholin's gland: Secondary | ICD-10-CM

## 2023-05-08 ENCOUNTER — Telehealth: Payer: Self-pay | Admitting: *Deleted

## 2023-05-08 MED ORDER — VALACYCLOVIR HCL 1 G PO TABS: 1000.0000 mg | ORAL_TABLET | Freq: Every day | ORAL | 2 refills | Status: DC

## 2023-05-08 NOTE — Telephone Encounter (Signed)
-----   Message from [place] sent at 05/08/2023  9:42 AM EST ----- Regarding: Medication Request Pt called in requesting refill on valACYclovir  (VALTREX ) 500 MG tablet. She is requesting 1000 MG instead.   Pharmacy : [facility]

## 2023-05-25 ENCOUNTER — Encounter: Payer: Self-pay | Admitting: Family Medicine

## 2023-05-25 DIAGNOSIS — N898 Other specified noninflammatory disorders of vagina: Secondary | ICD-10-CM

## 2023-05-26 MED ORDER — FLUCONAZOLE 150 MG PO TABS
150.0000 mg | ORAL_TABLET | Freq: Once | ORAL | 3 refills | Status: AC
Start: 2023-05-26 — End: 2023-05-26

## 2023-05-26 MED ORDER — METRONIDAZOLE 500 MG PO TABS: 500.0000 mg | ORAL_TABLET | Freq: Two times a day (BID) | ORAL | 1 refills | Status: DC

## 2023-06-10 ENCOUNTER — Other Ambulatory Visit: Payer: Self-pay | Admitting: *Deleted

## 2023-06-10 DIAGNOSIS — N751 Abscess of Bartholin's gland: Secondary | ICD-10-CM

## 2023-06-10 MED ORDER — SULFAMETHOXAZOLE-TRIMETHOPRIM 800-160 MG PO TABS: 1.0000 | ORAL_TABLET | Freq: Two times a day (BID) | ORAL | 0 refills | Status: DC

## 2023-07-17 ENCOUNTER — Ambulatory Visit: Admitting: Obstetrics & Gynecology

## 2023-07-29 ENCOUNTER — Ambulatory Visit: Admitting: Podiatry

## 2023-08-11 ENCOUNTER — Other Ambulatory Visit: Payer: Self-pay | Admitting: Obstetrics & Gynecology

## 2023-08-12 ENCOUNTER — Ambulatory Visit (INDEPENDENT_AMBULATORY_CARE_PROVIDER_SITE_OTHER): Admitting: Podiatry

## 2023-08-12 ENCOUNTER — Encounter: Payer: Self-pay | Admitting: Podiatry

## 2023-08-12 ENCOUNTER — Ambulatory Visit (INDEPENDENT_AMBULATORY_CARE_PROVIDER_SITE_OTHER)

## 2023-08-12 DIAGNOSIS — L84 Corns and callosities: Secondary | ICD-10-CM

## 2023-08-12 DIAGNOSIS — M7751 Other enthesopathy of right foot: Secondary | ICD-10-CM

## 2023-08-12 DIAGNOSIS — L02611 Cutaneous abscess of right foot: Secondary | ICD-10-CM | POA: Diagnosis not present

## 2023-08-12 DIAGNOSIS — L03031 Cellulitis of right toe: Secondary | ICD-10-CM

## 2023-08-12 MED ORDER — CEPHALEXIN 500 MG PO CAPS: 500.0000 mg | ORAL_CAPSULE | Freq: Three times a day (TID) | ORAL | 0 refills | Status: DC

## 2023-08-12 NOTE — Progress Notes (Signed)
 Subjective:  Patient ID: Katrina Harper, female    DOB: 09/28/1975,  MRN: [id number] HPI Chief Complaint  Patient presents with   Foot Pain    RM#6 Right foot pain pinky toe has a ulcer area in between pinky toe has been using antibacterial ointment. Patient concerned about foot pain is throbbing.    48 y.o. female presents with the above complaint.   ROS: Denies fever chills nausea vomiting muscle aches pains calf pain back pain chest pain shortness of breath.  She states that this wound to the medial aspect of the fifth digit right foot opens at least once a year but often x 2-3 times a year.  She states that she has been trying to keep the toes separated so that they wont rub and be problematic.  Past Medical History:  Diagnosis Date   ADHD    Bipolar 1 disorder (HCC)    Bipolar depression (HCC)    Depression    Herpes    Past Surgical History:  Procedure Laterality Date   INDUCED ABORTION      Current Outpatient Medications:    acetaminophen  (TYLENOL  8 HOUR) 650 MG CR tablet, Take 1 tablet (650 mg total) by mouth every 8 (eight) hours as needed for pain., Disp: 60 tablet, Rfl: 2   amphetamine-dextroamphetamine (ADDERALL) 20 MG tablet, Take 20 mg by mouth 4 (four) times daily., Disp: , Rfl:    ARIPiprazole (ABILIFY) 30 MG tablet, Take 30 mg by mouth daily., Disp: , Rfl:    cephALEXin (KEFLEX) 500 MG capsule, Take 1 capsule (500 mg total) by mouth 3 (three) times daily., Disp: 30 capsule, Rfl: 0   clonazePAM (KLONOPIN) 1 MG tablet, Take 1 mg by mouth 3 (three) times daily as needed for anxiety., Disp: , Rfl:    fluticasone  (FLONASE ) 50 MCG/ACT nasal spray, PLACE 1 SPRAY INTO BOTH NOSTRILS DAILY., Disp: 16 g, Rfl: 4   guanFACINE (INTUNIV) 2 MG TB24 ER tablet, Take 2 mg by mouth every morning., Disp: , Rfl:    ibuprofen  (ADVIL ) 600 MG tablet, TAKE 1 TABLET (600 MG TOTAL) BY MOUTH EVERY 6 (SIX) HOURS AS NEEDED., Disp: 60 tablet, Rfl: 4   ibuprofen  (ADVIL ,MOTRIN ) 200 MG tablet,  Take 200 mg by mouth every 6 (six) hours as needed., Disp: , Rfl:    levocetirizine (XYZAL ) 5 MG tablet, Take 1 tablet (5 mg total) by mouth every evening., Disp: 90 tablet, Rfl: 0   metroNIDAZOLE  (FLAGYL ) 500 MG tablet, Take 1 tablet (500 mg total) by mouth 2 (two) times daily., Disp: 14 tablet, Rfl: 1   PARAGARD  INTRAUTERINE COPPER  IU, by Intrauterine route., Disp: , Rfl:    sulfamethoxazole -trimethoprim  (BACTRIM  DS) 800-160 MG tablet, Take 1 tablet by mouth 2 (two) times daily., Disp: 14 tablet, Rfl: 0   triamcinolone  ointment (KENALOG ) 0.1 %, Apply 1 Application topically 2 (two) times daily as needed., Disp: 30 g, Rfl: 0   valACYclovir  (VALTREX ) 1000 MG tablet, Take 1 tablet (1,000 mg total) by mouth daily. Take for 5 days, Disp: 5 tablet, Rfl: 2   valACYclovir  (VALTREX ) 500 MG tablet, 1 tab po qday. Take 1 tab po bid x 3 days for an outbreak, Disp: 90 tablet, Rfl: 11  Allergies  Allergen Reactions   Lithium     "makes me toxic"   Ortho Tri-Cyclen [Norgestimate-Eth Estradiol] Other (See Comments)    "aggrivates bipolar"   Symbyax [Olanzapine-Fluoxetine Hcl] Other (See Comments)    "gained 40lb in 30 days"   Lamictal [Lamotrigine] Rash  Review of Systems Objective:  There were no vitals filed for this visit.  General: Well developed, nourished, in no acute distress, alert and oriented x3   Dermatological: Skin is warm, dry and supple bilateral. Nails x 10 are well maintained; remaining integument appears unremarkable at this time. There are no open sores, no preulcerative lesions, no rash or signs of infection present.  Heloma molle fourth webspace however she has an ulcerative lesion not demonstrating capsule measuring about 3 mm in diameter no purulence no malodor fifth digit right foot.  Macerated tissue in the webspaces noted.  Vascular: Dorsalis Pedis artery and Posterior Tibial artery pedal pulses are 2/4 bilateral with immedate capillary fill time. Pedal hair growth present. No  varicosities and no lower extremity edema present bilateral.   Neruologic: Grossly intact via light touch bilateral. Vibratory intact via tuning fork bilateral. Protective threshold with Semmes Wienstein monofilament intact to all pedal sites bilateral. Patellar and Achilles deep tendon reflexes 2+ bilateral. No Babinski or clonus noted bilateral.   Musculoskeletal: No gross boney pedal deformities bilateral. No pain, crepitus, or limitation noted with foot and ankle range of motion bilateral. Muscular strength 5/5 in all groups tested bilateral.  Gait: Unassisted, Nonantalgic.    Radiographs:  Radiographs taken today demonstrate osseously mature foot.  Fifth digit medial aspect of the head of the proximal phalanx does demonstrate what appears to be some early change cannot rule out osteomyelitic process at this point.  Assessment & Plan:   Assessment: Heloma molle with an open ulcerative lesion fifth digit right  Plan: Discussed with her keeping the foot dry applying Betadine dressing to the wound daily covering the wound daily keeping it clean.  Also wearing soft shoes.  Recommended the use of Keflex 500 mg 1 p.o. 3 times daily.  I would like to follow-up with her in 2 weeks.     Krystena Reitter T. [place], [place]

## 2023-08-14 ENCOUNTER — Ambulatory Visit: Admitting: Obstetrics & Gynecology

## 2023-08-26 ENCOUNTER — Ambulatory Visit: Admitting: Podiatry

## 2023-09-04 ENCOUNTER — Encounter: Payer: Self-pay | Admitting: Podiatry

## 2023-09-04 ENCOUNTER — Ambulatory Visit (INDEPENDENT_AMBULATORY_CARE_PROVIDER_SITE_OTHER): Admitting: Podiatry

## 2023-09-04 DIAGNOSIS — L02611 Cutaneous abscess of right foot: Secondary | ICD-10-CM | POA: Diagnosis not present

## 2023-09-04 DIAGNOSIS — M7751 Other enthesopathy of right foot: Secondary | ICD-10-CM | POA: Diagnosis not present

## 2023-09-04 DIAGNOSIS — L03031 Cellulitis of right toe: Secondary | ICD-10-CM | POA: Diagnosis not present

## 2023-09-04 MED ORDER — MELOXICAM 15 MG PO TABS: 15.0000 mg | ORAL_TABLET | Freq: Every day | ORAL | 3 refills | Status: DC

## 2023-09-05 NOTE — Progress Notes (Signed)
 Ulceration has gotten better she says but the toe just aches.  Objective: Vital signs are stable alert oriented x 3.  Pulses are palpable.  She does have some enlargement of the head of the proximal phalanx with some osteoarthritic changes which we did revisit radiographs demonstrating that.  The wound has completely healed up and there are no further signs of cellulitic process.  Assessment: Heloma molle currently healed no cellulitis.  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  Plan: Discussed the arthritis the mechanism of action etiology.  Did discuss the need for a arthroplasty at that level and a syndactylization.  She understands that and is amendable to it however she does not have time to do that right now.  So we dispensed Mobic 15 mg 1 p.o. daily #30.  3 refills.  She will watch for skin breakdown.  If there is any she will notify us  and we will refill her antibiotic.

## 2023-10-14 ENCOUNTER — Ambulatory Visit: Admitting: Podiatry

## 2023-12-08 ENCOUNTER — Other Ambulatory Visit: Payer: Self-pay | Admitting: Obstetrics & Gynecology

## 2023-12-08 DIAGNOSIS — Z760 Encounter for issue of repeat prescription: Secondary | ICD-10-CM

## 2023-12-10 ENCOUNTER — Encounter: Payer: Self-pay | Admitting: Obstetrics and Gynecology

## 2023-12-11 ENCOUNTER — Other Ambulatory Visit: Payer: Self-pay | Admitting: *Deleted

## 2023-12-11 MED ORDER — FLUCONAZOLE 150 MG PO TABS: 150.0000 mg | ORAL_TABLET | Freq: Once | ORAL | 1 refills | Status: AC

## 2023-12-16 ENCOUNTER — Other Ambulatory Visit: Payer: Self-pay | Admitting: *Deleted

## 2023-12-16 MED ORDER — VALACYCLOVIR HCL 500 MG PO TABS: 500.0000 mg | ORAL_TABLET | Freq: Every day | ORAL | 12 refills | Status: AC

## 2024-01-08 ENCOUNTER — Other Ambulatory Visit: Payer: Self-pay

## 2024-01-08 DIAGNOSIS — A6000 Herpesviral infection of urogenital system, unspecified: Secondary | ICD-10-CM

## 2024-01-08 MED ORDER — VALACYCLOVIR HCL 1 G PO TABS: 1000.0000 mg | ORAL_TABLET | Freq: Every day | ORAL | 2 refills | Status: DC

## 2024-01-08 NOTE — Progress Notes (Signed)
 Return call to pt regarding refill request for Valtrex  Rx sent per protocol.   Pharmacy confirmed pt made aware.

## 2024-02-19 ENCOUNTER — Encounter: Payer: Self-pay | Admitting: Obstetrics and Gynecology

## 2024-02-23 ENCOUNTER — Other Ambulatory Visit: Payer: Self-pay | Admitting: Obstetrics & Gynecology

## 2024-03-04 ENCOUNTER — Ambulatory Visit
Admission: EM | Admit: 2024-03-04 | Discharge: 2024-03-04 | Disposition: A | Discharge status: Home / Self Care | Attending: Family Medicine | Admitting: Family Medicine

## 2024-03-04 DIAGNOSIS — L0201 Cutaneous abscess of face: Secondary | ICD-10-CM

## 2024-03-04 MED ORDER — CLINDAMYCIN HCL 300 MG PO CAPS: 300.0000 mg | ORAL_CAPSULE | Freq: Four times a day (QID) | ORAL | 0 refills | Status: DC

## 2024-03-04 NOTE — ED Triage Notes (Signed)
 Pt present with an abscess on the rt side of the face. Pt states she woke up in the morning with her eye closed shut. States she has not been able to sleep in a few days.

## 2024-03-04 NOTE — Discharge Instructions (Addendum)
 Start clindamycin  4 times a day for 7 days.  Warm compresses to the area as needed.  Please take an over-the-counter probiotic or eat yogurt while taking the antibiotic as it has a higher propensity for causing diarrhea.  If you notice severe liquidy diarrhea please contact the clinic as soon as possible.  Please follow-up with your PCP in 2 days for recheck.  Please go to the emergency room if you develop any worsening symptoms which include but are not limited to fever, worsening swelling of the area.  Hope you feel better soon!

## 2024-03-04 NOTE — ED Provider Notes (Signed)
 [facility]    CSN: [id number] Arrival date & time: 03/04/24  0919      History   Chief Complaint Chief Complaint  Patient presents with   Abscess    HPI Katrina Harper is a 48 y.o. female presents for facial abscess.  Patient reports 3 days ago she noticed a bump to her right temple.  States its worsened over the past few days becoming red and swollen.  She states she woke today and had some swelling underneath the right eye.  She denies any fevers chills or injury to the area.  She does report she has been using a new strainer and thinks maybe she burned the area but did not realize it at the time.  No drainage from the area.  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  No other concerns at this time   Abscess   Past Medical History:  Diagnosis Date   ADHD    Bipolar 1 disorder (HCC)    Bipolar depression (HCC)    Depression    Herpes     Patient Active Problem List   Diagnosis Date Noted   Bipolar depression (HCC) 06/17/2016   Genital herpes 05/10/2014    Past Surgical History:  Procedure Laterality Date   INDUCED ABORTION      OB History     Gravida  2   Para      Term      Preterm      AB  2   Living         SAB      IAB  2   Ectopic      Multiple      Live Births               Home Medications    Prior to Admission medications   Medication Sig Start Date End Date Taking? Authorizing Provider  clindamycin  (CLEOCIN ) 300 MG capsule Take 1 capsule (300 mg total) by mouth 4 (four) times daily for 7 days. 03/04/24 03/11/24 Yes Emmanuelle Hibbitts, Jodi R, NP  acetaminophen  (TYLENOL  8 HOUR) 650 MG CR tablet Take 1 tablet (650 mg total) by mouth every 8 (eight) hours as needed for pain. 02/13/23   Anyanwu, Ugonna A, MD  amphetamine-dextroamphetamine (ADDERALL) 20 MG tablet Take 20 mg by mouth 4 (four) times daily.    [provider]  ARIPiprazole (ABILIFY) 30 MG tablet Take 30 mg by mouth daily.    [provider]  clonazePAM (KLONOPIN)  1 MG tablet Take 1 mg by mouth 3 (three) times daily as needed for anxiety.    [provider]  fluticasone  (FLONASE ) 50 MCG/ACT nasal spray PLACE 1 SPRAY INTO BOTH NOSTRILS DAILY. 12/11/23   Anyanwu, Ugonna A, MD  guanFACINE (INTUNIV) 2 MG TB24 ER tablet Take 2 mg by mouth every morning. 01/06/21   [provider]  ibuprofen  (ADVIL ) 600 MG tablet TAKE 1 TABLET (600 MG TOTAL) BY MOUTH EVERY 6 (SIX) HOURS AS NEEDED. 03/01/24   Anyanwu, Gloris LABOR, MD  ibuprofen  (ADVIL ,MOTRIN ) 200 MG tablet Take 200 mg by mouth every 6 (six) hours as needed.    [provider]  levocetirizine (XYZAL ) 5 MG tablet Take 1 tablet (5 mg total) by mouth every evening. 01/16/22   Christopher Savannah, PA-C  meloxicam  (MOBIC ) 15 MG tablet Take 1 tablet (15 mg total) by mouth daily. 09/04/23   Hyatt, Max T, DPM  PARAGARD  INTRAUTERINE COPPER  IU by Intrauterine route. 01/23/21   [provider]  triamcinolone  ointment (KENALOG ) 0.1 % Apply 1 Application topically 2 (two) times daily as needed. 03/10/23   Anyanwu, Ugonna A, MD  valACYclovir  (VALTREX ) 1000 MG tablet Take 1 tablet (1,000 mg total) by mouth daily. Take for 5 days 01/08/24   Anyanwu, Ugonna A, MD  valACYclovir  (VALTREX ) 500 MG tablet 1 tab po qday. Take 1 tab po bid x 3 days for an outbreak 03/10/23   Anyanwu, Gloris LABOR, MD  valACYclovir  (VALTREX ) 500 MG tablet Take 1 tablet (500 mg total) by mouth daily. Can increase to twice a day for 5 days in the event of a recurrence 12/16/23   Anyanwu, Gloris LABOR, MD    Family History Family History  Problem Relation Age of Onset   Hypertension Mother    Alcohol abuse Mother    Migraines Mother    Other Mother        cognitive issues started recently   Cancer Father        melanoma on eye   Hypertension Father    Alcohol abuse Father    Arthritis Maternal Grandmother    Mental illness Maternal Grandmother    Alcohol abuse Maternal Grandfather    Migraines Sister    Hypertension Paternal Grandfather      Social History Social History   Tobacco Use   Smoking status: Every Day    Types: Cigars   Smokeless tobacco: Never   Tobacco comments:     smokes '3 bowls of cannabis' daily  Vaping Use   Vaping status: Former  Substance Use Topics   Alcohol use: No    Alcohol/week: 0.0 standard drinks of alcohol   Drug use: Yes    Frequency: 1.0 times per week    Types: Marijuana    Comment: 3 bowls of cannibis daily     Allergies   Lithium, Ortho tri-cyclen [norgestimate-eth estradiol], Symbyax [olanzapine-fluoxetine hcl], and Lamictal [lamotrigine]   Review of Systems Review of Systems  Skin:        Facial abscess     Physical Exam Triage Vital Signs ED Triage Vitals  Encounter Vitals Group     BP 03/04/24 0931 (!) 148/114     Girls Systolic BP Percentile --      Girls Diastolic BP Percentile --      Boys Systolic BP Percentile --      Boys Diastolic BP Percentile --      Pulse Rate 03/04/24 0931 99     Resp 03/04/24 0931 18     Temp 03/04/24 0931 97.7 F (36.5 C)     Temp src --      SpO2 03/04/24 0931 97 %     Weight --      Height --      Head Circumference --      Peak Flow --      Pain Score 03/04/24 0930 7     Pain Loc --      Pain Education --      Exclude from Growth Chart --    No data found.  Updated Vital Signs BP (!) 148/114   Pulse 99   Temp 97.7 F (36.5 C)   Resp 18   LMP  (LMP Unknown)   SpO2 97%   Visual Acuity Right Eye Distance:   Left Eye Distance:   Bilateral Distance:    Right Eye Near:   Left Eye Near:    Bilateral Near:     Physical Exam Vitals and nursing  note reviewed.  Constitutional:      General: She is not in acute distress.    Appearance: Normal appearance. She is not ill-appearing.  HENT:     Head: Normocephalic and atraumatic.      Comments: There is a scabbed lesion to the right temple with induration without fluctuance.  Mild erythema.  Tender to palpation.  There is soft tissue swelling underneath the  right eye without induration fluctuance or erythema. Eyes:     Pupils: Pupils are equal, round, and reactive to light.  Cardiovascular:     Rate and Rhythm: Normal rate.  Pulmonary:     Effort: Pulmonary effort is normal.  Skin:    General: Skin is warm and dry.  Neurological:     General: No focal deficit present.     Mental Status: She is alert and oriented to person, place, and time.  Psychiatric:        Mood and Affect: Mood normal.        Behavior: Behavior normal.      UC Treatments / Results  Labs (all labs ordered are listed, but only abnormal results are displayed) Labs Reviewed - No data to display  EKG   Radiology No results found.  Procedures Procedures (including critical care time)  Medications Ordered in UC Medications - No data to display  Initial Impression / Assessment and Plan / UC Course  I have reviewed the triage vital signs and the nursing notes.  Pertinent labs & imaging results that were available during my care of the patient were reviewed by me and considered in my medical decision making (see chart for details).     Reviewed exam and symptoms with patient.  She is afebrile in clinic and well-appearing.  Will do clindamycin  given history of MRSA, side effect profile reviewed.  Advise warm compresses and PCP follow-up in 2 days for recheck.  Strict ER precautions were reviewed and patient verbalized understanding. Final Clinical Impressions(s) / UC Diagnoses   Final diagnoses:  Facial abscess     Discharge Instructions      Start clindamycin  4 times a day for 7 days.  Warm compresses to the area as needed.  Please take an over-the-counter probiotic or eat yogurt while taking the antibiotic as it has a higher propensity for causing diarrhea.  If you notice severe liquidy diarrhea please contact the clinic as soon as possible.  Please follow-up with your PCP in 2 days for recheck.  Please go to the emergency room if you develop any  worsening symptoms which include but are not limited to fever, worsening swelling of the area.  Hope you feel better soon!     ED Prescriptions     Medication Sig Dispense Auth. Provider   clindamycin  (CLEOCIN ) 300 MG capsule Take 1 capsule (300 mg total) by mouth 4 (four) times daily for 7 days. 28 capsule Glendon Fiser, Jodi R, NP      PDMP not reviewed this encounter.   Loreda Myla SAUNDERS, NP 03/04/24 [phone]

## 2024-03-06 ENCOUNTER — Other Ambulatory Visit: Payer: Self-pay

## 2024-03-06 ENCOUNTER — Emergency Department (HOSPITAL_BASED_OUTPATIENT_CLINIC_OR_DEPARTMENT_OTHER)

## 2024-03-06 ENCOUNTER — Inpatient Hospital Stay (HOSPITAL_COMMUNITY)

## 2024-03-06 ENCOUNTER — Encounter (HOSPITAL_BASED_OUTPATIENT_CLINIC_OR_DEPARTMENT_OTHER): Payer: Self-pay | Admitting: Emergency Medicine

## 2024-03-06 ENCOUNTER — Inpatient Hospital Stay (HOSPITAL_BASED_OUTPATIENT_CLINIC_OR_DEPARTMENT_OTHER)
Admission: EM | Admit: 2024-03-06 | Discharge: 2024-03-09 | DRG: 603 | Disposition: A | Attending: Internal Medicine | Admitting: Internal Medicine

## 2024-03-06 DIAGNOSIS — F419 Anxiety disorder, unspecified: Secondary | ICD-10-CM | POA: Insufficient documentation

## 2024-03-06 DIAGNOSIS — I10 Essential (primary) hypertension: Secondary | ICD-10-CM | POA: Insufficient documentation

## 2024-03-06 DIAGNOSIS — F909 Attention-deficit hyperactivity disorder, unspecified type: Secondary | ICD-10-CM | POA: Insufficient documentation

## 2024-03-06 DIAGNOSIS — L03213 Periorbital cellulitis: Secondary | ICD-10-CM | POA: Diagnosis not present

## 2024-03-06 DIAGNOSIS — F319 Bipolar disorder, unspecified: Secondary | ICD-10-CM | POA: Diagnosis present

## 2024-03-06 LAB — CBC WITH DIFFERENTIAL/PLATELET
Abs Immature Granulocytes: 0.02 K/uL (ref 0.00–0.07)
Basophils Absolute: 0.1 K/uL (ref 0.0–0.1)
Basophils Relative: 1 %
Eosinophils Absolute: 0.4 K/uL (ref 0.0–0.5)
Eosinophils Relative: 4 %
HCT: 38.8 % (ref 36.0–46.0)
Hemoglobin: 13.4 g/dL (ref 12.0–15.0)
Immature Granulocytes: 0 %
Lymphocytes Relative: 22 %
Lymphs Abs: 2 K/uL (ref 0.7–4.0)
MCH: 33.7 pg (ref 26.0–34.0)
MCHC: 34.5 g/dL (ref 30.0–36.0)
MCV: 97.5 fL (ref 80.0–100.0)
Monocytes Absolute: 0.9 K/uL (ref 0.1–1.0)
Monocytes Relative: 10 %
Neutro Abs: 5.6 K/uL (ref 1.7–7.7)
Neutrophils Relative %: 63 %
Platelets: 235 K/uL (ref 150–400)
RBC: 3.98 MIL/uL (ref 3.87–5.11)
RDW: 12.6 % (ref 11.5–15.5)
WBC: 8.9 K/uL (ref 4.0–10.5)
nRBC: 0 % (ref 0.0–0.2)

## 2024-03-06 LAB — BASIC METABOLIC PANEL WITH GFR
Anion gap: 7 (ref 5–15)
BUN: 8 mg/dL (ref 6–20)
CO2: 25 mmol/L (ref 22–32)
Calcium: 8.3 mg/dL — ABNORMAL LOW (ref 8.9–10.3)
Chloride: 106 mmol/L (ref 98–111)
Creatinine, Ser: 0.69 mg/dL (ref 0.44–1.00)
GFR, Estimated: >60 mL/min (ref >60)
Glucose, Bld: 98 mg/dL (ref 70–99)
Potassium: 4.3 mmol/L (ref 3.5–5.1)
Sodium: 137 mmol/L (ref 135–145)

## 2024-03-06 LAB — HCG, SERUM, QUALITATIVE: Preg, Serum: NEGATIVE

## 2024-03-06 MED ORDER — SODIUM CHLORIDE 0.9 % IV SOLN: INTRAVENOUS | Status: DC

## 2024-03-06 MED ORDER — SODIUM CHLORIDE 0.9 % IV BOLUS
500.0000 mL | Freq: Once | INTRAVENOUS | Status: AC
  Administered 2024-03-06: 500 mL via INTRAVENOUS

## 2024-03-06 MED ORDER — VANCOMYCIN HCL IN DEXTROSE 1-5 GM/200ML-% IV SOLN
1000.0000 mg | Freq: Two times a day (BID) | INTRAVENOUS | Status: DC
  Administered 2024-03-06 – 2024-03-07 (×3): 1000 mg via INTRAVENOUS
  Filled 2024-03-06 (×4): qty 200

## 2024-03-06 MED ORDER — AMPHETAMINE-DEXTROAMPHETAMINE 10 MG PO TABS
20.0000 mg | ORAL_TABLET | Freq: Three times a day (TID) | ORAL | Status: DC
  Administered 2024-03-07: 20 mg via ORAL
  Filled 2024-03-06: qty 2

## 2024-03-06 MED ORDER — ARIPIPRAZOLE 5 MG PO TABS
30.0000 mg | ORAL_TABLET | Freq: Every day | ORAL | Status: DC
  Administered 2024-03-06 – 2024-03-08 (×3): 30 mg via ORAL
  Filled 2024-03-06 (×2): qty 6
  Filled 2024-03-06: qty 3

## 2024-03-06 MED ORDER — ONDANSETRON HCL 4 MG PO TABS: 4.0000 mg | ORAL_TABLET | Freq: Four times a day (QID) | ORAL | Status: DC | PRN

## 2024-03-06 MED ORDER — ONDANSETRON HCL 4 MG/2ML IJ SOLN: 4.0000 mg | Freq: Four times a day (QID) | INTRAMUSCULAR | Status: DC | PRN

## 2024-03-06 MED ORDER — CLONAZEPAM 1 MG PO TABS
1.0000 mg | ORAL_TABLET | Freq: Three times a day (TID) | ORAL | Status: DC | PRN
  Administered 2024-03-06 – 2024-03-08 (×3): 1 mg via ORAL
  Filled 2024-03-06 (×3): qty 1

## 2024-03-06 MED ORDER — ONDANSETRON HCL 4 MG/2ML IJ SOLN
4.0000 mg | Freq: Once | INTRAMUSCULAR | Status: AC
  Administered 2024-03-06: 4 mg via INTRAVENOUS
  Filled 2024-03-06: qty 2

## 2024-03-06 MED ORDER — LIDOCAINE HCL (PF) 1 % IJ SOLN
5.0000 mL | Freq: Once | INTRAMUSCULAR | Status: AC
  Administered 2024-03-06: 5 mL
  Filled 2024-03-06: qty 5

## 2024-03-06 MED ORDER — PIPERACILLIN-TAZOBACTAM 3.375 G IVPB 30 MIN
3.3750 g | Freq: Once | INTRAVENOUS | Status: AC
  Administered 2024-03-06: 3.375 g via INTRAVENOUS
  Filled 2024-03-06: qty 50

## 2024-03-06 MED ORDER — LORAZEPAM 2 MG/ML IJ SOLN: 0.5000 mg | INTRAMUSCULAR | Status: DC | PRN

## 2024-03-06 MED ORDER — GUANFACINE HCL ER 1 MG PO TB24
2.0000 mg | ORAL_TABLET | Freq: Every morning | ORAL | Status: DC
  Administered 2024-03-07 – 2024-03-08 (×2): 2 mg via ORAL
  Filled 2024-03-06 (×3): qty 2

## 2024-03-06 MED ORDER — VANCOMYCIN HCL IN DEXTROSE 1-5 GM/200ML-% IV SOLN
1000.0000 mg | Freq: Once | INTRAVENOUS | Status: AC
  Administered 2024-03-06: 1000 mg via INTRAVENOUS
  Filled 2024-03-06: qty 200

## 2024-03-06 MED ORDER — MORPHINE SULFATE (PF) 4 MG/ML IV SOLN
4.0000 mg | INTRAVENOUS | Status: AC | PRN
  Administered 2024-03-06 (×2): 4 mg via INTRAVENOUS
  Filled 2024-03-06 (×2): qty 1

## 2024-03-06 MED ORDER — GADOBUTROL 1 MMOL/ML IV SOLN
7.5000 mL | Freq: Once | INTRAVENOUS | Status: AC | PRN
  Administered 2024-03-06: 7.5 mL via INTRAVENOUS

## 2024-03-06 MED ORDER — SENNOSIDES-DOCUSATE SODIUM 8.6-50 MG PO TABS: 1.0000 | ORAL_TABLET | Freq: Every evening | ORAL | Status: DC | PRN

## 2024-03-06 MED ORDER — PIPERACILLIN-TAZOBACTAM 4.5 G IVPB
4.5000 g | Freq: Once | INTRAVENOUS | Status: DC
  Filled 2024-03-06: qty 100

## 2024-03-06 MED ORDER — HYDROMORPHONE HCL 1 MG/ML IJ SOLN: 0.5000 mg | INTRAMUSCULAR | Status: AC | PRN

## 2024-03-06 MED ORDER — ENOXAPARIN SODIUM 40 MG/0.4ML IJ SOSY
40.0000 mg | PREFILLED_SYRINGE | INTRAMUSCULAR | Status: DC
  Administered 2024-03-06 – 2024-03-07 (×2): 40 mg via SUBCUTANEOUS
  Filled 2024-03-06 (×3): qty 0.4

## 2024-03-06 MED ORDER — IOHEXOL 300 MG/ML  SOLN
75.0000 mL | Freq: Once | INTRAMUSCULAR | Status: AC | PRN
  Administered 2024-03-06: 75 mL via INTRAVENOUS

## 2024-03-06 MED ORDER — ACETAMINOPHEN 325 MG PO TABS
650.0000 mg | ORAL_TABLET | Freq: Four times a day (QID) | ORAL | Status: DC | PRN
  Administered 2024-03-06 – 2024-03-07 (×4): 650 mg via ORAL
  Filled 2024-03-06 (×4): qty 2

## 2024-03-06 MED ORDER — HYDROMORPHONE HCL 1 MG/ML IJ SOLN
1.0000 mg | Freq: Once | INTRAMUSCULAR | Status: AC
  Administered 2024-03-06: 1 mg via INTRAVENOUS
  Filled 2024-03-06: qty 1

## 2024-03-06 MED ORDER — HYDRALAZINE HCL 20 MG/ML IJ SOLN
5.0000 mg | Freq: Four times a day (QID) | INTRAMUSCULAR | Status: DC | PRN
  Administered 2024-03-06: 5 mg via INTRAVENOUS
  Filled 2024-03-06: qty 1

## 2024-03-06 MED ORDER — ACETAMINOPHEN 650 MG RE SUPP: 650.0000 mg | Freq: Four times a day (QID) | RECTAL | Status: DC | PRN

## 2024-03-06 MED ORDER — METRONIDAZOLE 500 MG/100ML IV SOLN
500.0000 mg | Freq: Two times a day (BID) | INTRAVENOUS | Status: DC
  Administered 2024-03-06 – 2024-03-07 (×2): 500 mg via INTRAVENOUS
  Filled 2024-03-06 (×2): qty 100

## 2024-03-06 MED ORDER — SODIUM CHLORIDE 0.9 % IV SOLN
2.0000 g | Freq: Three times a day (TID) | INTRAVENOUS | Status: DC
  Administered 2024-03-06 – 2024-03-08 (×6): 2 g via INTRAVENOUS
  Filled 2024-03-06 (×6): qty 12.5

## 2024-03-06 NOTE — Plan of Care (Signed)

## 2024-03-06 NOTE — Assessment & Plan Note (Signed)
 Home Abilify  30 mg nightly resumed

## 2024-03-06 NOTE — Assessment & Plan Note (Signed)
 Suspect secondary to periorbital cellulitis Hydralazine  5 mg IV q6h prn for sbp

## 2024-03-06 NOTE — ED Triage Notes (Signed)
 Worsening swelling and pain around right eye. Eval. By UC x 2 days ago and started on Clindamycin . Right periorbital edema noted

## 2024-03-06 NOTE — Progress Notes (Signed)
 Pharmacy Antibiotic Note  Katrina Harper is a 48 y.o. female for which pharmacy has been consulted for cefepime  and vancomycin  dosing for periorbital cellulitis.  SCr 0.69 WBC 8.9; T 98.4; HR 74; RR 18  Zosyn  x 1 in ED  Plan: Cefepime  2g q8hr  Vancomycin  [facility] --will start vancomycin  1000 mg q12hr (eAUC 508) unless change in renal function Monitor WBC, fever, renal function, cultures De-escalate when able Levels at steady state     Temp (24hrs), Avg:97.6 F (36.4 C), Min:97.6 F (36.4 C), Max:97.6 F (36.4 C)  Recent Labs  Lab 03/06/24 0857  WBC 8.9  CREATININE 0.69    CrCl cannot be calculated (Unknown ideal weight.).    Allergies  Allergen Reactions   Lithium     makes me toxic   Ortho Tri-Cyclen [Norgestimate-Eth Estradiol] Other (See Comments)    aggrivates bipolar   Symbyax [Olanzapine-Fluoxetine Hcl] Other (See Comments)    gained 40lb in 30 days   Lamictal [Lamotrigine] Rash   Microbiology results: Pending  Thank you for allowing pharmacy to be a part of this patient's care.  Dorn Buttner, PharmD, BCPS 03/06/2024 11:48 AM ED Clinical Pharmacist -  [phone]

## 2024-03-06 NOTE — Assessment & Plan Note (Signed)
 Home clonazepam  as needed resumed

## 2024-03-06 NOTE — ED Notes (Signed)
 Spoke with Katrina Harper at [facility] regarding Hospitalist consult

## 2024-03-06 NOTE — ED Provider Notes (Signed)
 [facility] Provider Note   CSN: [id number] Arrival date & time: 03/06/24  9167     Patient presents with: Facial Swelling   Katrina Harper is a 48 y.o. female with a history of bipolar depression, presents to the ED with ongoing right-sided facial swelling that began 4 days ago. The symptoms started as what she described as an ingrown hair on her right temple, patient states that over the course of a couple days the area began to swell and started to involve her right eye, patient went to urgent care and was put on clindamycin  and has been compliant with regimen- patient states that yesterday the abscess ruptured and now she has increased swelling and pain around her right eye. The patient reports wax/wane blurry vision, and pain around the right eye.  Patient denies any fevers, rashes. No recent travel. No sick contacts.  The patient is in no acute distress.   HPI     Prior to Admission medications   Medication Sig Start Date End Date Taking? Authorizing Provider  acetaminophen  (TYLENOL  8 HOUR) 650 MG CR tablet Take 1 tablet (650 mg total) by mouth every 8 (eight) hours as needed for pain. 02/13/23   Anyanwu, Ugonna A, MD  amphetamine -dextroamphetamine  (ADDERALL) 20 MG tablet Take 20 mg by mouth 4 (four) times daily.    [provider]  ARIPiprazole  (ABILIFY ) 30 MG tablet Take 30 mg by mouth daily.    [provider]  clindamycin  (CLEOCIN ) 300 MG capsule Take 1 capsule (300 mg total) by mouth 4 (four) times daily for 7 days. 03/04/24 03/11/24  Mayer, Jodi R, NP  clonazePAM  (KLONOPIN ) 1 MG tablet Take 1 mg by mouth 3 (three) times daily as needed for anxiety.    [provider]  fluticasone  (FLONASE ) 50 MCG/ACT nasal spray PLACE 1 SPRAY INTO BOTH NOSTRILS DAILY. 12/11/23   Anyanwu, Ugonna A, MD  guanFACINE  (INTUNIV ) 2 MG TB24 ER tablet Take 2 mg by mouth every morning. 01/06/21   [provider]  ibuprofen   (ADVIL ) 600 MG tablet TAKE 1 TABLET (600 MG TOTAL) BY MOUTH EVERY 6 (SIX) HOURS AS NEEDED. 03/01/24   Anyanwu, Gloris LABOR, MD  ibuprofen  (ADVIL ,MOTRIN ) 200 MG tablet Take 200 mg by mouth every 6 (six) hours as needed.    [provider]  levocetirizine (XYZAL ) 5 MG tablet Take 1 tablet (5 mg total) by mouth every evening. 01/16/22   Christopher Savannah, PA-C  meloxicam  (MOBIC ) 15 MG tablet Take 1 tablet (15 mg total) by mouth daily. 09/04/23   Hyatt, Max T, DPM  PARAGARD  INTRAUTERINE COPPER  IU by Intrauterine route. 01/23/21   [provider]  triamcinolone  ointment (KENALOG ) 0.1 % Apply 1 Application topically 2 (two) times daily as needed. 03/10/23   Anyanwu, Ugonna A, MD  valACYclovir  (VALTREX ) 1000 MG tablet Take 1 tablet (1,000 mg total) by mouth daily. Take for 5 days 01/08/24   Anyanwu, Ugonna A, MD  valACYclovir  (VALTREX ) 500 MG tablet 1 tab po qday. Take 1 tab po bid x 3 days for an outbreak 03/10/23   Anyanwu, Gloris LABOR, MD  valACYclovir  (VALTREX ) 500 MG tablet Take 1 tablet (500 mg total) by mouth daily. Can increase to twice a day for 5 days in the event of a recurrence 12/16/23   Anyanwu, Gloris LABOR, MD    Allergies: Lithium, Ortho tri-cyclen [norgestimate-eth estradiol], Symbyax [olanzapine-fluoxetine hcl], and Lamictal [lamotrigine]    Review of Systems  HENT:  Eye pain  Skin:        Abscess    Updated Vital Signs BP (!) 170/93   Pulse (!) 110   Temp 98.2 F (36.8 C) (Oral)   Resp 20   Ht 5' 6 (1.676 m)   Wt 74.5 kg   LMP  (LMP Unknown)   SpO2 100%   BMI 26.51 kg/m   Physical Exam Vitals and nursing note reviewed.  Constitutional:      General: She is not in acute distress.    Appearance: Normal appearance.  HENT:     Head: Normocephalic and atraumatic.  Eyes:     General: Vision grossly intact. Gaze aligned appropriately. No visual field deficit.    Extraocular Movements: Extraocular movements intact.     Conjunctiva/sclera: Conjunctivae normal.      Right eye: Right conjunctiva is not injected. No chemosis, exudate or hemorrhage.    Pupils: Pupils are equal, round, and reactive to light.     Comments: Patient eyelid is edematous and erythematous. Patient denies vision loss, however states that she does have some wax/wane blurriness on exam given the eyelid swelling.  No exophthalmos.  Cardiovascular:     Rate and Rhythm: Normal rate and regular rhythm.     Pulses: Normal pulses.  Pulmonary:     Effort: Pulmonary effort is normal. No respiratory distress.  Abdominal:     General: Abdomen is flat.     Palpations: Abdomen is soft.     Tenderness: There is no abdominal tenderness.  Musculoskeletal:        General: Normal range of motion.     Cervical back: Normal range of motion.  Skin:    General: Skin is warm and dry.     Capillary Refill: Capillary refill takes less than 2 seconds.  Neurological:     General: No focal deficit present.     Mental Status: She is alert. Mental status is at baseline.  Psychiatric:        Mood and Affect: Mood normal.           (all labs ordered are listed, but only abnormal results are displayed) Labs Reviewed  BASIC METABOLIC PANEL WITH GFR - Abnormal; Notable for the following components:      Result Value   Calcium 8.3 (*)    All other components within normal limits  CBC WITH DIFFERENTIAL/PLATELET  HCG, SERUM, QUALITATIVE    EKG: None  Radiology: CT Orbits W Contrast Result Date: 03/06/2024 EXAM: CT ORBITS WITH CONTRAST 03/06/2024 09:55:10 AM TECHNIQUE: CT of the orbits was performed with the administration of 75 mL of iohexol  (OMNIPAQUE ) 300 MG/ML solution. Multiplanar reformatted images are provided for review. Automated exposure control, iterative reconstruction, and/or weight based adjustment of the mA/kV was utilized to reduce the radiation dose to as low as reasonably achievable. COMPARISON: CT without contrast 07/22/2006. CLINICAL HISTORY: Possible orbital cellulitis.  FINDINGS: ORBITS: Globes are intact. Normal extraocular muscles. Normal optic nerve-sheath complexes. No hematoma or inflammatory change. No mass. No post septal inflammatory changes are present. No focal abscess is present. SOFT TISSUES: Right paraorbital edema. Paraorbital cellulitis. Extensive skin thickening and subcutaneous stranding is present over the right side of the face extending into the right temporal area. Periorbital soft tissue swelling is present. SINUSES AND MASTOIDS: No acute abnormality. BONES: No acute abnormality. IMPRESSION: 1. Right periorbital cellulitis with extensive skin thickening and subcutaneous stranding extending into the right temporal area. 2. No postseptal inflammatory changes or focal abscess. Electronically signed  by: Lonni Necessary MD 03/06/2024 10:27 AM EST RP Workstation: [id number]     .Incision and Drainage  Date/Time: 03/06/2024 11:33 AM  Performed by: Willma Duwaine CROME, PA Authorized by: Willma Duwaine CROME, PA   Consent:    Consent obtained:  Verbal   Consent given by:  Patient   Risks, benefits, and alternatives were discussed: yes     Risks discussed:  Bleeding, incomplete drainage, pain, damage to other organs and infection   Alternatives discussed:  No treatment, delayed treatment, alternative treatment, observation and referral Universal protocol:    Procedure explained and questions answered to patient or proxy's satisfaction: yes     Relevant documents present and verified: yes     Site/side marked: yes     Immediately prior to procedure, a time out was called: yes     Patient identity confirmed:  Verbally with patient, arm band and hospital-assigned identification number Location:    Type:  Abscess   Size:  1 cm   Location:  Head (Right temporal region)   Head location:  Face Pre-procedure details:    Skin preparation:  Antiseptic wash and povidone-iodine Sedation:    Sedation type:  None Anesthesia:    Anesthesia method:  Local  infiltration   Local anesthetic:  Lidocaine  1% w/o epi Procedure type:    Complexity:  Simple Procedure details:    Ultrasound guidance: yes     Needle aspiration: no     Incision types:  Single straight   Incision depth:  Subcutaneous   Wound management:  Extensive cleaning   Drainage:  Purulent   Drainage amount:  Moderate   Wound treatment:  Wound left open   Packing materials:  None Post-procedure details:    Procedure completion:  Tolerated well, no immediate complications    Medications Ordered in the ED  ARIPiprazole  (ABILIFY ) tablet 30 mg (has no administration in time range)  guanFACINE  (INTUNIV ) ER tablet 2 mg (has no administration in time range)  clonazePAM  (KLONOPIN ) tablet 1 mg (has no administration in time range)  enoxaparin  (LOVENOX ) injection 40 mg (has no administration in time range)  senna-docusate (Senokot-S) tablet 1 tablet (has no administration in time range)  acetaminophen  (TYLENOL ) tablet 650 mg (has no administration in time range)    Or  acetaminophen  (TYLENOL ) suppository 650 mg (has no administration in time range)  ondansetron  (ZOFRAN ) tablet 4 mg (has no administration in time range)    Or  ondansetron  (ZOFRAN ) injection 4 mg (has no administration in time range)  0.9 %  sodium chloride  infusion ( Intravenous New Bag/Given 03/06/24 1447)  morphine  (PF) 4 MG/ML injection 4 mg (4 mg Intravenous Given 03/06/24 1309)  HYDROmorphone  (DILAUDID ) injection 0.5 mg (has no administration in time range)  amphetamine -dextroamphetamine  (ADDERALL) tablet 20 mg (has no administration in time range)  ceFEPIme  (MAXIPIME ) 2 g in sodium chloride  0.9 % 100 mL IVPB (has no administration in time range)  vancomycin  (VANCOCIN ) IVPB 1000 mg/200 mL premix (has no administration in time range)  LORazepam  (ATIVAN ) injection 0.5 mg (has no administration in time range)  hydrALAZINE  (APRESOLINE ) injection 5 mg (5 mg Intravenous Given 03/06/24 1523)  metroNIDAZOLE  (FLAGYL ) IVPB  500 mg (has no administration in time range)  HYDROmorphone  (DILAUDID ) injection 1 mg (1 mg Intravenous Given 03/06/24 0933)  ondansetron  (ZOFRAN ) injection 4 mg (4 mg Intravenous Given 03/06/24 0933)  vancomycin  (VANCOCIN ) IVPB 1000 mg/200 mL premix (0 mg Intravenous Stopped 03/06/24 1205)  piperacillin -tazobactam (ZOSYN ) IVPB 3.375 g (0 g Intravenous Stopped  03/06/24 1219)  iohexol  (OMNIPAQUE ) 300 MG/ML solution 75 mL (75 mLs Intravenous Contrast Given 03/06/24 0940)  lidocaine  (PF) (XYLOCAINE ) 1 % injection 5 mL (5 mLs Infiltration Given by Other 03/06/24 1104)  sodium chloride  0.9 % bolus 500 mL (0 mLs Intravenous Stopped 03/06/24 1252)                                  Medical Decision Making Amount and/or Complexity of Data Reviewed Labs: ordered. Decision-making details documented in ED Course. Radiology: ordered. Decision-making details documented in ED Course.  Risk Prescription drug management. Decision regarding hospitalization.   Patient presents to the ED for: facial abscess This involves an extensive number of treatment options and is a complaint that carries with it a high risk of complications  Differential diagnosis includes: Periorbital cellulitis Orbital cellulitis Conjunctivitis Co-morbid conditions: History of previous MRSA infections  Additional history/records obtained and reviewed: Additional history obtained from  outside medical records External records from outside source obtained and reviewed including PCP records, urgent care record and treatment plan from 12/4 was reviewed.  Clinical Course as of 03/06/24 1528  Sat Mar 06, 2024  0904 Temp: 97.6 F (36.4 C) Afebrile, vital stable, patient in no acute distress [ML]  0924 Zosyn , vancomycin  started for periorbital cellulitis [ML]  0924 CT Orbits W Contrast CT orbit to rule out orbital cellulitis [ML]  0935 Basic metabolic panel(!) No acute findings [ML]  0936 hCG, serum, qualitative Negative [ML]  0936  CBC with Differential WNL [ML]  1006 Visual acuity WNL [ML]  1033 CT Orbits W Contrast Right periorbital cellulitis with skin thickening/subcutaneous stranding [ML]  1033 On reassessment, patient tolerating antibiotics well. [ML]  1033 Hydromorphone  and Zofran  given for pain and nausea relief -well-tolerated. [ML]  1125 Small abscess drained - patient tolerated procedure well.  [ML]  1138 Dr. Dreama spoke with hospitalist -to admit [ML]    Clinical Course User Index [ML] Willma Duwaine CROME, PA    Data Reviewed / Actions Taken: Labs ordered/reviewed with my independent interpretation in ED course above. Imaging ordered/reviewed with my independent interpretation in ED course above. I agree with the radiologists interpretation.  Key findings for the patient were reviewed with the attending physician, and ongoing clinical collaboration was maintained throughout the visit  Management / Treatments: See ED course above for medications, treatments administered, and clinical rationale.   Reevaluation of the patient after these medicines showed that the patient improved  I have reviewed the patients home medicines and have made adjustments as needed  ED Course / Reassessments: Problem List: Periorbital cellulitis 48 year old female presented for facial abscess. Initial assessment included history, physical exam, and review of prior medical records. Patients physical exam included revealed right eye involvement, tenderness around orbit, eyelid swelling and erythema. Laboratory studies, imaging, and other ancillary studies were obtained and key results included right periorbital cellulitis on CT. Pain and symptoms were addressed during the visit. Vital signs were obtained and monitored, and the patient remained stable throughout the stay.  Given the patient's history of clindamycin  regimen without relief, physical exam findings, and imaging plan to admit patient for further evaluation and care of  periorbital cellulitis. patient response: improved with pain management Serial reassessments performed: Yes    Consultations:  Hospitalist Consult recommendations incorporated into plan: Hospitalist to admit patient for further evaluation and care of periorbital cellulitis  Disposition: Disposition: Admission Rationale for disposition: Patient requires further evaluation and  care. Disposition plan and rationale were discussed with the patient at the bedside, all questions were addressed, and the patient demonstrated understanding.  This note was produced using Electronics Engineer. While I have reviewed and verified all clinical information, transcription errors may remain.     Final diagnoses:  None    ED Discharge Orders     None          Willma Duwaine CROME, [place] 03/06/24 1529    Dreama Longs, MD 03/09/24 [phone]

## 2024-03-06 NOTE — ED Notes (Signed)
ED Provider at bedside for I&D.   

## 2024-03-06 NOTE — Progress Notes (Signed)
 Brief Cross-cover note for Virtual admit Patient seen and examined, reviewed H&P/orders  Right periobital swelling/pain since the past thrusday, worsened inspite of being on Clindamycin   O/E EOM intact/no diplopia  Continue plan as outlined by Dr Sherre Will add Flagyl  for anaerobic coverage

## 2024-03-06 NOTE — Assessment & Plan Note (Signed)
 Home Adderall 20 mg, 3 times daily resumed

## 2024-03-06 NOTE — Assessment & Plan Note (Signed)
 Vancomycin  and cefepime  per pharmacy ordered Patient will need MRI of the right orbital on arrival to hospital bed Symptomatic pain support: Morphine  4 mg IV every 4 hours as needed for moderate pain, 2 days ordered; Dilaudid  0.5 mg every 4 hours as needed for severe pain, 1 day ordered Sodium chloride  500 mL bolus one-time dose ordered on admission AM team to reevaluate patient at bedside for continued pain medication support

## 2024-03-06 NOTE — ED Notes (Addendum)
 Attempted IV access to R hand, unsuccessful, tol well. EDP to use U/S for IV access

## 2024-03-06 NOTE — Progress Notes (Signed)
 Eye is swelling and is much worse than on admission

## 2024-03-06 NOTE — Hospital Course (Addendum)
 Ms. Jonetta Mcgarvey is a 48 year old female with bipolar, anxiety, ADHD.  Noted on 12/4: She presented to urgent care and was diagnosed with facial abscess on the right temporal area.  Patient was discharged with clindamycin  4 times daily for 7-day course.    12/6: She presented to the ED for chief concerns of right sided eye swelling and redness  Vitals at the time of my evaluation showed patient with t of 97.6, rr of 18, heart rate 74, blood pressure 117/70, SpO2 of 99% on room air.  Serum sodium is 137, potassium 4.3, chloride 106, bicarb 25, BUN of 8, serum creatinine 0.69, eGFR greater than 60, nonfasting glucose 98, WBC 8.9, hemoglobin 13.4, platelets of 235.  ED treatment: Vancomycin  per pharmacy, Zosyn  per pharmacy.  Dilaudid  1 mg IV one-time dose, ondansetron  4 mg IV one-time dose.  EDP attempted I&D for small abscess.  12/6 at approximately 10:45a: I was paged regarding patient and at 11:32a, patient was admitted to [facility] [employer]

## 2024-03-06 NOTE — Progress Notes (Signed)
 This patient has received 46.0 ml's of IV Omni 300 contrast extravasation into the Right AC during a CT Orbits w/ contrast exam.  The exam was performed on: 03/06/2024 at 0955am  Site / affected area assessed by Camie Stain RT(R) (CT) and Benedetta Gouge RN  Provider contacted: Rocky Massy, MD Provider contacted date/time: 03/06/2024 at 1005am

## 2024-03-06 NOTE — H&P (Addendum)
 History and Physical   [name] FMW:[id number] DOB: [birth year] DOA: 03/06/2024  PCP: Corlis Pagan, NP  Patient coming from: home  Referring provider: Dr. Dreama, EDP Telemedicine provider: Dr. Sherre Patient location: [facility] Emergency Department at [facility] Referring diagnosis: Periorbital cellulitis, right Patient name and DOB verified: patient was able to confirm her name: Katrina Harper and her DOB: 11-17-1975. Patient consented to Telemedicine Evaluation: yes RN virtual assistant: Quintin Abernethy, RN Video encounter time and date: 03/06/24 at approximately 12:30p.  Chief Concern: right eye swelling and temporal swelling and redness  HPI: Ms. Katrina Harper is a 48 year old female with bipolar, anxiety, ADHD.  Noted on 12/4: She presented to urgent care and was diagnosed with facial abscess on the right temporal area.  Patient was discharged with clindamycin  4 times daily for 7-day course.    12/6: She presented to the ED for chief concerns of right sided eye swelling and redness  Vitals at the time of my evaluation showed patient with t of 97.6, rr of 18, heart rate 74, blood pressure 117/70, SpO2 of 99% on room air.  Serum sodium is 137, potassium 4.3, chloride 106, bicarb 25, BUN of 8, serum creatinine 0.69, eGFR greater than 60, nonfasting glucose 98, WBC 8.9, hemoglobin 13.4, platelets of 235.  ED treatment: Vancomycin  per pharmacy, Zosyn  per pharmacy.  Dilaudid  1 mg IV one-time dose, ondansetron  4 mg IV one-time dose.  EDP attempted I&D for small abscess.  12/6 at approximately 10:45a: I was paged regarding patient and at 11:32a, patient was admitted to [facility]. ---------------------------------- At bedside, via virtual telemedicine encounter, patient was able to tell me her first and last name, her date of birth, her age.  She knows she is in the emergency department.  She reports that she noticed on Monday, 12/1, there was a  ingrown hair or cystic acne on her right temple.  She did pop the pimple as this is not unusual for her.  She then noticed that on Thursday, there was a change on her left temple, and she presented to urgent care center for further evaluation.  She was given antibiotic on Thursday at that time and she reports she has been compliant with the antibiotic.  She woke up this morning and noticed a significant increase in swelling and redness now involving the skin around her right eye, prompting her to present to the emergency department for further evaluation.  She reports that on Wednesday she had subjective fever however did not check her temperature because she did not have a thermostat at home.  She endorses some nausea and denies vomiting.  Social history: She lives at home on her own and recently adopted a dog within the last month.  She denies tobacco smoking, quitting cigars recently but now is vaping nicotine.  She denies EtOH.  She denies IV recreational drug use.  She endorses THC smoking.  ROS: Constitutional: no weight change, no fever ENT/Mouth: no sore throat, no rhinorrhea Eyes: no eye pain, no vision changes Cardiovascular: no chest pain, no dyspnea,  no edema, no palpitations Respiratory: no cough, no sputum, no wheezing Gastrointestinal: no nausea, no vomiting, no diarrhea, no constipation Genitourinary: no urinary incontinence, no dysuria, no hematuria Musculoskeletal: no arthralgias, no myalgias Skin: + skin swelling around right eye and temple. + skin redness in temple area Neuro: no weakness, no loss of consciousness, no syncope Psych: no anxiety, no depression, no decrease appetite Heme/Lymph: no bruising, no bleeding  ED Course: Discussed  with EDP, patient requiring hospitalization for chief concerns of right periorbital cellulitis failing outpatient therapy.  Assessment/Plan  Principal Problem:   Periorbital cellulitis of right eye Active Problems:   Bipolar  depression (HCC)   Anxiety   ADHD   Assessment and Plan:  * Periorbital cellulitis of right eye Vancomycin  and cefepime  per pharmacy ordered Patient will need MRI of the right orbital on arrival to hospital bed Symptomatic pain support: Morphine  4 mg IV every 4 hours as needed for moderate pain, 2 days ordered; Dilaudid  0.5 mg every 4 hours as needed for severe pain, 1 day ordered Sodium chloride  500 mL bolus one-time dose ordered on admission AM team to reevaluate patient at bedside for continued pain medication support  ADHD Home Adderall 20 mg, 3 times daily resumed  Anxiety Home clonazepam  as needed resumed  Bipolar depression (HCC) Home Abilify  30 mg nightly resumed  Chart reviewed.   DVT prophylaxis: Enoxaparin  subcutaneous q. 24 hours Code Status: Full code Diet: Regular diet Family Communication: A phone call was offered, patient declined stating that she has already tried to update her family Disposition Plan: Pending clinical course, guarded prognosis at this time Consults called: Pharmacy Admission status: MedSurg  Past Medical History:  Diagnosis Date   ADHD    Bipolar 1 disorder (HCC)    Bipolar depression (HCC)    Depression    Herpes    Past Surgical History:  Procedure Laterality Date   INDUCED ABORTION     Social History:  reports that she has been smoking cigars. She has never used smokeless tobacco. She reports current drug use. Frequency: 1.00 time per week. Drug: Marijuana. She reports that she does not drink alcohol.  Allergies  Allergen Reactions   Lithium     makes me toxic   Ortho Tri-Cyclen [Norgestimate-Eth Estradiol] Other (See Comments)    aggrivates bipolar   Symbyax [Olanzapine-Fluoxetine Hcl] Other (See Comments)    gained 40lb in 30 days   Lamictal [Lamotrigine] Rash   Family History  Problem Relation Age of Onset   Hypertension Mother    Alcohol abuse Mother    Migraines Mother    Other Mother        cognitive  issues started recently   Cancer Father        melanoma on eye   Hypertension Father    Alcohol abuse Father    Arthritis Maternal Grandmother    Mental illness Maternal Grandmother    Alcohol abuse Maternal Grandfather    Migraines Sister    Hypertension Paternal Grandfather    Family history: Family history reviewed and not pertinent.  Prior to Admission medications   Medication Sig Start Date End Date Taking? Authorizing Provider  acetaminophen  (TYLENOL  8 HOUR) 650 MG CR tablet Take 1 tablet (650 mg total) by mouth every 8 (eight) hours as needed for pain. 02/13/23   Anyanwu, Ugonna A, MD  amphetamine -dextroamphetamine  (ADDERALL) 20 MG tablet Take 20 mg by mouth 4 (four) times daily.    [provider]  ARIPiprazole  (ABILIFY ) 30 MG tablet Take 30 mg by mouth daily.    [provider]  clindamycin  (CLEOCIN ) 300 MG capsule Take 1 capsule (300 mg total) by mouth 4 (four) times daily for 7 days. 03/04/24 03/11/24  Mayer, Jodi R, NP  clonazePAM  (KLONOPIN ) 1 MG tablet Take 1 mg by mouth 3 (three) times daily as needed for anxiety.    [provider]  fluticasone  (FLONASE ) 50 MCG/ACT nasal spray PLACE 1 SPRAY  INTO BOTH NOSTRILS DAILY. 12/11/23   Anyanwu, Ugonna A, MD  guanFACINE  (INTUNIV ) 2 MG TB24 ER tablet Take 2 mg by mouth every morning. 01/06/21   [provider]  ibuprofen  (ADVIL ) 600 MG tablet TAKE 1 TABLET (600 MG TOTAL) BY MOUTH EVERY 6 (SIX) HOURS AS NEEDED. 03/01/24   Anyanwu, Gloris LABOR, MD  ibuprofen  (ADVIL ,MOTRIN ) 200 MG tablet Take 200 mg by mouth every 6 (six) hours as needed.    [provider]  levocetirizine (XYZAL ) 5 MG tablet Take 1 tablet (5 mg total) by mouth every evening. 01/16/22   Christopher Savannah, PA-C  meloxicam  (MOBIC ) 15 MG tablet Take 1 tablet (15 mg total) by mouth daily. 09/04/23   Hyatt, Max T, DPM  PARAGARD  INTRAUTERINE COPPER  IU by Intrauterine route. 01/23/21   [provider]  triamcinolone  ointment (KENALOG ) 0.1 %  Apply 1 Application topically 2 (two) times daily as needed. 03/10/23   Anyanwu, Ugonna A, MD  valACYclovir  (VALTREX ) 1000 MG tablet Take 1 tablet (1,000 mg total) by mouth daily. Take for 5 days 01/08/24   Anyanwu, Ugonna A, MD  valACYclovir  (VALTREX ) 500 MG tablet 1 tab po qday. Take 1 tab po bid x 3 days for an outbreak 03/10/23   Anyanwu, Gloris LABOR, MD  valACYclovir  (VALTREX ) 500 MG tablet Take 1 tablet (500 mg total) by mouth daily. Can increase to twice a day for 5 days in the event of a recurrence 12/16/23   Herchel Gloris LABOR, MD   Physical Exam: Vitals:   03/06/24 0845 03/06/24 1250 03/06/24 1303 03/06/24 1315  BP: 117/70   126/79  Pulse: 74   76  Resp: 18   18  Temp: 97.6 F (36.4 C) 98.4 F (36.9 C)  98.4 F (36.9 C)  TempSrc: Oral Oral  Oral  SpO2: 99%   100%  Weight:   74.5 kg   Height:   5' 6 (1.676 m)    Physical exam was completed with the assistance of RN, Anissa Rayfield who was present at bedside during this portion of the encounter:  Constitutional: appears age appropriate and mildly anxious Eyes: EOM intact, lids and conjunctivae normal ENMT: Mucous membranes are moist. Hearing appropriate Neck: normal, supple, no masses, no thyromegaly Respiratory: clear to auscultation bilaterally, no wheezing, no crackles. Normal respiratory effort. No accessory muscle use.  Cardiovascular: Regular rate and rhythm, no murmurs. No extremity edema.  Abdomen: no tenderness. Bowel sounds positive.  Musculoskeletal: no clubbing. No joint deformity upper and lower extremities. Good ROM, no contractures, no atrophy. Skin: Right sided periorbital redness with edema presents with right sided temporal involvement.  Neurologic: Sensation intact. Strength appropriate in all 4.  Psychiatric: Normal judgment and insight. Alert and oriented x 3. Normal mood.   EKG: not indicated at this time  Chest x-ray on Admission: not indicated at this time.  CT Orbits W Contrast Result Date:  03/06/2024 EXAM: CT ORBITS WITH CONTRAST 03/06/2024 09:55:10 AM TECHNIQUE: CT of the orbits was performed with the administration of 75 mL of iohexol  (OMNIPAQUE ) 300 MG/ML solution. Multiplanar reformatted images are provided for review. Automated exposure control, iterative reconstruction, and/or weight based adjustment of the mA/kV was utilized to reduce the radiation dose to as low as reasonably achievable. COMPARISON: CT without contrast 07/22/2006. CLINICAL HISTORY: Possible orbital cellulitis. FINDINGS: ORBITS: Globes are intact. Normal extraocular muscles. Normal optic nerve-sheath complexes. No hematoma or inflammatory change. No mass. No post septal inflammatory changes are present. No focal abscess is present. SOFT TISSUES: Right  paraorbital edema. Paraorbital cellulitis. Extensive skin thickening and subcutaneous stranding is present over the right side of the face extending into the right temporal area. Periorbital soft tissue swelling is present. SINUSES AND MASTOIDS: No acute abnormality. BONES: No acute abnormality. IMPRESSION: 1. Right periorbital cellulitis with extensive skin thickening and subcutaneous stranding extending into the right temporal area. 2. No postseptal inflammatory changes or focal abscess. Electronically signed by: Lonni Necessary MD 03/06/2024 10:27 AM EST RP Workstation: [id number]   Labs on Admission: I have personally reviewed following labs  CBC: Recent Labs  Lab 03/06/24 0857  WBC 8.9  NEUTROABS 5.6  HGB 13.4  HCT 38.8  MCV 97.5  PLT 235   Basic Metabolic Panel: Recent Labs  Lab 03/06/24 0857  NA 137  K 4.3  CL 106  CO2 25  GLUCOSE 98  BUN 8  CREATININE 0.69  CALCIUM 8.3*   Urine analysis:    Component Value Date/Time   COLORURINE YELLOW 04/17/2020 1329   APPEARANCEUR CLEAR 04/17/2020 1329   APPEARANCEUR Clear 11/27/2017 1825   LABSPEC 1.020 04/17/2020 1329   PHURINE 6.0 04/17/2020 1329   GLUCOSEU NEGATIVE 04/17/2020 1329   HGBUR  NEGATIVE 04/17/2020 1329   BILIRUBINUR NEGATIVE 04/17/2020 1329   BILIRUBINUR Negative 11/27/2017 1825   KETONESUR NEGATIVE 04/17/2020 1329   PROTEINUR NEGATIVE 04/17/2020 1329   UROBILINOGEN 0.2 12/07/2012 1237   NITRITE NEGATIVE 04/17/2020 1329   LEUKOCYTESUR TRACE (A) 04/17/2020 1329   This document was prepared using Dragon Voice Recognition software and may include unintentional dictation errors.  Dr. Sherre [facility] Location:   If 7PM-7AM, please contact overnight-coverage provider If 7AM-7PM, please contact day attending provider [email or web address]  03/06/2024, 1:18 PM

## 2024-03-07 LAB — BASIC METABOLIC PANEL WITH GFR
Anion gap: 6 (ref 5–15)
BUN: 5 mg/dL — ABNORMAL LOW (ref 6–20)
CO2: 24 mmol/L (ref 22–32)
Calcium: 7.6 mg/dL — ABNORMAL LOW (ref 8.9–10.3)
Chloride: 109 mmol/L (ref 98–111)
Creatinine, Ser: 0.63 mg/dL (ref 0.44–1.00)
GFR, Estimated: >60 mL/min (ref >60)
Glucose, Bld: 97 mg/dL (ref 70–99)
Potassium: 3.6 mmol/L (ref 3.5–5.1)
Sodium: 139 mmol/L (ref 135–145)

## 2024-03-07 LAB — CBC
HCT: 34.7 % — ABNORMAL LOW (ref 36.0–46.0)
Hemoglobin: 12.5 g/dL (ref 12.0–15.0)
MCH: 34.3 pg — ABNORMAL HIGH (ref 26.0–34.0)
MCHC: 36 g/dL (ref 30.0–36.0)
MCV: 95.3 fL (ref 80.0–100.0)
Platelets: 179 K/uL (ref 150–400)
RBC: 3.64 MIL/uL — ABNORMAL LOW (ref 3.87–5.11)
RDW: 12.6 % (ref 11.5–15.5)
WBC: 13 K/uL — ABNORMAL HIGH (ref 4.0–10.5)
nRBC: 0 % (ref 0.0–0.2)

## 2024-03-07 MED ORDER — METRONIDAZOLE 500 MG PO TABS
500.0000 mg | ORAL_TABLET | Freq: Two times a day (BID) | ORAL | Status: DC
  Administered 2024-03-07 – 2024-03-08 (×2): 500 mg via ORAL
  Filled 2024-03-07 (×2): qty 1

## 2024-03-07 MED ORDER — AMPHETAMINE-DEXTROAMPHETAMINE 10 MG PO TABS
20.0000 mg | ORAL_TABLET | Freq: Three times a day (TID) | ORAL | Status: DC
  Administered 2024-03-07 – 2024-03-09 (×5): 20 mg via ORAL
  Filled 2024-03-07 (×6): qty 2

## 2024-03-07 NOTE — Progress Notes (Signed)
 PROGRESS NOTE  Lamica Harper FMW:[id number] DOB: [birth year]   PCP: Katrina Pagan, NP  Patient is from: Home.  DOA: 03/06/2024 LOS: 1  Chief complaints Chief Complaint  Patient presents with   Facial Swelling     Brief Narrative / Interim history: 48 year old F with PMH of anxiety, bipolar disorder and ADHD presented to [facility] ED with right eye swelling and redness, and admitted with working diagnosis of preorbital cellulitis.  Patient noted an ingrown hair or cystic acne on her right temple that she popped on 12/1.  Then she noticed erythema and swelling on her right temple and went to urgent care on 12/4.  She was prescribed clindamycin .  She woke up with increased swelling and redness that has spread to right eyelids on 12/6 that prompted her to come to ED.  She had difficulty opening her right eye.  In ED, stable vitals.  BMP and CBC without significant finding.  Pregnancy test negative.  CT orbits with contrast showed right periorbital cellulitis with extensive skin thickening and subcutaneous stranding extending into the right temporal area but no postseptal inflammatory change or focal abscess.  She had right temporal abscess drained in ED.  Patient was started on broad-spectrum antibiotic, and admitted.  MRI orbits with and without contrast confirmed CT finding.  Patient's symptoms improving with broad-spectrum antibiotics.   Subjective: Seen and examined earlier this morning.  No major events overnight or this morning.  Reports improvement in his swelling.  Able to open the eye easily today.  Denies eye pain, vision change or headache.   Assessment and plan: Preorbital cellulitis of right eye: Failed outpatient treatment with p.o. clindamycin .  Had small temporal abscess drained in ED.  Unfortunately sample was not sent for culture or Gram stain.  No eye pain or vision change.  Right pupil slightly bigger than the left (chronic).  EOMI.  Still with erythema but  swelling improved.  Mild leukocytosis today. -Continue broad-spectrum antibiotics with vancomycin , cefepime  and Flagyl  today -Discontinue IV antibiotics  Anxiety, bipolar disorder and ADHD: Stable -Continue home meds.  Leukocytosis: Likely due to #1 -Continue monitoring -Antibiotics as above  Body mass index is 26.51 kg/m.          DVT prophylaxis:  enoxaparin  (LOVENOX ) injection 40 mg Start: 03/06/24 2200 Place TED hose Start: 03/06/24 1426  Code Status: Full code Family Communication: None at the bedside Level of care: Med-Surg Status is: Inpatient Remains inpatient appropriate because: For right eye periorbital cellulitis   Final disposition: Home   55 minutes with more than 50% spent in reviewing records, counseling patient/family and coordinating care.  Consultants:  None  Procedures: 12/6-I&D of right temporal abscess in ED  Microbiology summarized: None  Objective: Vitals:   03/06/24 1523 03/06/24 1627 03/06/24 2007 03/07/24 0800  BP: (!) 170/93 135/60 123/70 107/64  Pulse:  (!) 104 (!) 108 75  Resp:  18 16 18   Temp:  99.3 F (37.4 C) 97.9 F (36.6 C) 97.7 F (36.5 C)  TempSrc:  Axillary Oral Axillary  SpO2:  99% 97% 99%  Weight:      Height:        Examination:  GENERAL: No apparent distress.  Nontoxic. HEENT: MMM.  Vision intact.  EOMI.  PERRL but right pupil lightly greater than left (chronic).  Erythema and swelling over right temple and around right eye. NECK: Supple.  No apparent JVD.  RESP:  No IWOB.  Fair aeration bilaterally. CVS:  RRR. Heart sounds normal.  ABD/GI/GU: BS+. Abd soft, NTND.  MSK/EXT:  Moves extremities. No apparent deformity. No edema.  SKIN: As above.  See pictures below for more.  NEURO: AA.  Oriented appropriately.  No apparent focal neuro deficit. PSYCH: Calm. Normal affect.   03/06/2024    03/07/2024  Sch Meds:  Scheduled Meds:  amphetamine -dextroamphetamine   20 mg Oral TID   ARIPiprazole   30 mg Oral  QHS   enoxaparin  (LOVENOX ) injection  40 mg Subcutaneous Q24H   guanFACINE   2 mg Oral q morning   metroNIDAZOLE   500 mg Oral Q12H   Continuous Infusions:  ceFEPime  (MAXIPIME ) IV 2 g (03/07/24 0315)   vancomycin  1,000 mg (03/06/24 2258)   PRN Meds:.acetaminophen  **OR** acetaminophen , clonazePAM , hydrALAZINE , HYDROmorphone  (DILAUDID ) injection, LORazepam , morphine  injection, ondansetron  **OR** ondansetron  (ZOFRAN ) IV, senna-docusate  Antimicrobials: Anti-infectives (From admission, onward)    Start     Dose/Rate Route Frequency Ordered Stop   03/07/24 2000  metroNIDAZOLE  (FLAGYL ) tablet 500 mg        500 mg Oral Every 12 hours 03/07/24 0940     03/06/24 2200  vancomycin  (VANCOCIN ) IVPB 1000 mg/200 mL premix        1,000 mg 200 mL/hr over 60 Minutes Intravenous Every 12 hours 03/06/24 1307     03/06/24 1800  ceFEPIme  (MAXIPIME ) 2 g in sodium chloride  0.9 % 100 mL IVPB        2 g 200 mL/hr over 30 Minutes Intravenous Every 8 hours 03/06/24 1307     03/06/24 1600  metroNIDAZOLE  (FLAGYL ) IVPB 500 mg  Status:  Discontinued        500 mg 100 mL/hr over 60 Minutes Intravenous Every 12 hours 03/06/24 1512 03/07/24 0939   03/06/24 0945  piperacillin -tazobactam (ZOSYN ) IVPB 3.375 g        3.375 g 100 mL/hr over 30 Minutes Intravenous  Once 03/06/24 0935 03/06/24 1219   03/06/24 0930  vancomycin  (VANCOCIN ) IVPB 1000 mg/200 mL premix        1,000 mg 200 mL/hr over 60 Minutes Intravenous  Once 03/06/24 0924 03/06/24 1205   03/06/24 0930  piperacillin -tazobactam (ZOSYN ) IVPB 4.5 g  Status:  Discontinued        4.5 g 200 mL/hr over 30 Minutes Intravenous  Once 03/06/24 0924 03/06/24 0935        I have personally reviewed the following labs and images: CBC: Recent Labs  Lab 03/06/24 0857 03/07/24 0537  WBC 8.9 13.0*  NEUTROABS 5.6  --   HGB 13.4 12.5  HCT 38.8 34.7*  MCV 97.5 95.3  PLT 235 179   BMP &GFR Recent Labs  Lab 03/06/24 0857 03/07/24 0537  NA 137 139  K 4.3 3.6  CL  106 109  CO2 25 24  GLUCOSE 98 97  BUN 8 5*  CREATININE 0.69 0.63  CALCIUM 8.3* 7.6*   Estimated Creatinine Clearance: 91.7 mL/min (by C-G formula based on SCr of 0.63 mg/dL). Liver & Pancreas: No results for input(s): AST, ALT, ALKPHOS, BILITOT, PROT, ALBUMIN in the last 168 hours. No results for input(s): LIPASE, AMYLASE in the last 168 hours. No results for input(s): AMMONIA in the last 168 hours. Diabetic: No results for input(s): HGBA1C in the last 72 hours. No results for input(s): GLUCAP in the last 168 hours. Cardiac Enzymes: No results for input(s): CKTOTAL, CKMB, CKMBINDEX, TROPONINI in the last 168 hours. No results for input(s): PROBNP in the last 8760 hours. Coagulation Profile: No results for input(s): INR, PROTIME in the last 168 hours.  Thyroid  Function Tests: No results for input(s): TSH, T4TOTAL, FREET4, T3FREE, THYROIDAB in the last 72 hours. Lipid Profile: No results for input(s): CHOL, HDL, LDLCALC, TRIG, CHOLHDL, LDLDIRECT in the last 72 hours. Anemia Panel: No results for input(s): VITAMINB12, FOLATE, FERRITIN, TIBC, IRON, RETICCTPCT in the last 72 hours. Urine analysis:    Component Value Date/Time   COLORURINE YELLOW 04/17/2020 1329   APPEARANCEUR CLEAR 04/17/2020 1329   APPEARANCEUR Clear 11/27/2017 1825   LABSPEC 1.020 04/17/2020 1329   PHURINE 6.0 04/17/2020 1329   GLUCOSEU NEGATIVE 04/17/2020 1329   HGBUR NEGATIVE 04/17/2020 1329   BILIRUBINUR NEGATIVE 04/17/2020 1329   BILIRUBINUR Negative 11/27/2017 1825   KETONESUR NEGATIVE 04/17/2020 1329   PROTEINUR NEGATIVE 04/17/2020 1329   UROBILINOGEN 0.2 12/07/2012 1237   NITRITE NEGATIVE 04/17/2020 1329   LEUKOCYTESUR TRACE (A) 04/17/2020 1329   Sepsis Labs: Invalid input(s): PROCALCITONIN, LACTICIDVEN  Microbiology: No results found for this or any previous visit (from the past 240 hours).  Radiology Studies: MR ORBITS W  WO CONTRAST Result Date: 03/06/2024 EXAM: MRI ORBITS WITHOUT AND WITH CONTRAST 03/06/2024 04:07:51 PM TECHNIQUE: Multiplanar, multisequence MRI of the orbits was performed without and with intravenous contrast. 7.5 mL (gadobutrol  (GADAVIST ) 1 MMOL/ML injection 7.5 mL GADOBUTROL  1 MMOL/ML IV SOLN) was administered. COMPARISON: None available CLINICAL HISTORY: Periorbital cellulitis FINDINGS: ORBITS: Underlying globe is within normal limits. Lenses are normally located. Symmetric caliber and signal of the optic nerves. Symmetric extraocular muscles. No orbital mass. No abnormal enhancement. No post septal inflammatory changes are present. SINUSES AND MASTOIDS: Clear. BRAIN: No acute abnormality. BONES AND SOFT TISSUES: Normal bone marrow signal. Diffuse subcutaneous edema and skin thickening is present with the right face and periorbital region. IMPRESSION: 1. Right periorbital and facial subcutaneous edema and skin thickening, consistent with periorbital cellulitis. 2. No postseptal inflammatory changes. Electronically signed by: Lonni Necessary MD 03/06/2024 06:14 PM EST RP Workstation: [id number]      Ujbz T. Berlinda Farve [facility]  If 7PM-7AM, please contact night-coverage [email or web address] 03/07/2024, 10:24 AM

## 2024-03-07 NOTE — Plan of Care (Signed)

## 2024-03-07 NOTE — Plan of Care (Signed)

## 2024-03-08 LAB — RENAL FUNCTION PANEL
Albumin: 2.6 g/dL — ABNORMAL LOW (ref 3.5–5.0)
Anion gap: 6 (ref 5–15)
BUN: 8 mg/dL (ref 6–20)
CO2: 25 mmol/L (ref 22–32)
Calcium: 8 mg/dL — ABNORMAL LOW (ref 8.9–10.3)
Chloride: 111 mmol/L (ref 98–111)
Creatinine, Ser: 0.77 mg/dL (ref 0.44–1.00)
GFR, Estimated: >60 mL/min (ref >60)
Glucose, Bld: 96 mg/dL (ref 70–99)
Phosphorus: 2.5 mg/dL (ref 2.5–4.6)
Potassium: 3.7 mmol/L (ref 3.5–5.1)
Sodium: 142 mmol/L (ref 135–145)

## 2024-03-08 LAB — CBC
HCT: 37.4 % (ref 36.0–46.0)
Hemoglobin: 12.8 g/dL (ref 12.0–15.0)
MCH: 33.2 pg (ref 26.0–34.0)
MCHC: 34.2 g/dL (ref 30.0–36.0)
MCV: 97.1 fL (ref 80.0–100.0)
Platelets: 181 K/uL (ref 150–400)
RBC: 3.85 MIL/uL — ABNORMAL LOW (ref 3.87–5.11)
RDW: 12.7 % (ref 11.5–15.5)
WBC: 6.1 K/uL (ref 4.0–10.5)
nRBC: 0 % (ref 0.0–0.2)

## 2024-03-08 LAB — MAGNESIUM: Magnesium: 1.9 mg/dL (ref 1.7–2.4)

## 2024-03-08 MED ORDER — DOXYCYCLINE HYCLATE 100 MG PO TABS
100.0000 mg | ORAL_TABLET | Freq: Two times a day (BID) | ORAL | Status: DC
  Administered 2024-03-08 (×2): 100 mg via ORAL
  Filled 2024-03-08 (×2): qty 1

## 2024-03-08 MED ORDER — FLUTICASONE PROPIONATE 50 MCG/ACT NA SUSP
2.0000 | Freq: Every day | NASAL | Status: DC
  Administered 2024-03-08: 2 via NASAL
  Filled 2024-03-08: qty 16

## 2024-03-08 MED ORDER — AMOXICILLIN-POT CLAVULANATE 875-125 MG PO TABS
1.0000 | ORAL_TABLET | Freq: Two times a day (BID) | ORAL | Status: DC
  Administered 2024-03-08 (×2): 1 via ORAL
  Filled 2024-03-08 (×2): qty 1

## 2024-03-08 MED ORDER — AZELASTINE HCL 0.1 % NA SOLN
2.0000 | Freq: Two times a day (BID) | NASAL | Status: DC
  Administered 2024-03-08: 2 via NASAL
  Filled 2024-03-08: qty 30

## 2024-03-08 MED ORDER — VALACYCLOVIR HCL 500 MG PO TABS
500.0000 mg | ORAL_TABLET | Freq: Every day | ORAL | Status: DC
  Administered 2024-03-08: 500 mg via ORAL
  Filled 2024-03-08 (×2): qty 1

## 2024-03-08 MED ORDER — LORATADINE 10 MG PO TABS
10.0000 mg | ORAL_TABLET | Freq: Every day | ORAL | Status: DC
  Administered 2024-03-08: 10 mg via ORAL
  Filled 2024-03-08: qty 1

## 2024-03-08 MED ORDER — SALINE SPRAY 0.65 % NA SOLN: 2.0000 | NASAL | Status: DC | PRN

## 2024-03-08 MED ORDER — KETOTIFEN FUMARATE 0.035 % OP SOLN
1.0000 [drp] | Freq: Two times a day (BID) | OPHTHALMIC | Status: DC
  Administered 2024-03-08: 1 [drp] via OPHTHALMIC
  Filled 2024-03-08: qty 5

## 2024-03-08 NOTE — Plan of Care (Signed)
  Problem: Education: Goal: Knowledge of General Education information will improve Description: Including pain rating scale, medication(s)/side effects and non-pharmacologic comfort measures Outcome: Progressing   Problem: Activity: Goal: Risk for activity intolerance will decrease Outcome: Adequate for Discharge   Problem: Nutrition: Goal: Adequate nutrition will be maintained Outcome: Progressing   Problem: Coping: Goal: Level of anxiety will decrease Outcome: Progressing

## 2024-03-08 NOTE — TOC CM/SW Note (Signed)
 Transition of Care [facility]) - Inpatient Brief Assessment   Patient Details  Name: Katrina Harper MRN: [id number] Date of Birth: Jun 29, 1975  [facility] CM/SW Contact:    Lauraine FORBES Saa, LCSWA Phone Number: 03/08/2024, 9:21 AM   Clinical Narrative:  9:21 AM Per chart review, patient resides at home alone. Patient has a PCP and insurance. Patient does not have SNF/HH/DME history. Patient's preferred pharmacy's are [facility] [address] and [facility]. No TOC needs identified at this time. TOC will continue to follow.  Transition of Care Asessment: Insurance and Status: Insurance coverage has been reviewed Patient has primary care physician: Yes Home environment has been reviewed: Private Residence Prior level of function:: N/A Prior/Current Home Services: No current home services Social Drivers of Health Review: SDOH reviewed no interventions necessary Readmission risk has been reviewed: Yes (Currently Green 10%) Transition of care needs: no transition of care needs at this time

## 2024-03-08 NOTE — Progress Notes (Signed)
 PROGRESS NOTE  Katrina Harper FMW:[id number] DOB: 1976/03/22   PCP: Corlis Pagan, NP  Patient is from: Home.  DOA: 03/06/2024 LOS: 2  Chief complaints Chief Complaint  Patient presents with   Facial Swelling     Brief Narrative / Interim history: 48 year old F with PMH of anxiety, bipolar disorder and ADHD presented to [facility] ED with right eye swelling and redness, and admitted with working diagnosis of preorbital cellulitis.  Patient noted an ingrown hair or cystic acne on her right temple that she popped on 12/1.  Then she noticed erythema and swelling on her right temple and went to urgent care on 12/4.  She was prescribed clindamycin .  She woke up with increased swelling and redness that has spread to right eyelids on 12/6 that prompted her to come to ED.  She had difficulty opening her right eye.  In ED, stable vitals.  BMP and CBC without significant finding.  Pregnancy test negative.  CT orbits with contrast showed right periorbital cellulitis with extensive skin thickening and subcutaneous stranding extending into the right temporal area but no postseptal inflammatory change or focal abscess.  She had right temporal abscess drained in ED. unfortunately abscess culture was not sent.  Patient was started on broad-spectrum antibiotic, and admitted.  MRI orbits with and without contrast confirmed CT finding.  Patient's symptoms improving with broad-spectrum antibiotics.   Subjective: Seen and examined earlier this morning.  No major events overnight or this morning.  Reports improvement in his swelling, redness and pain.   Assessment and plan: Preorbital cellulitis of right eye: Failed outpatient treatment with p.o. clindamycin .  Had small temporal abscess drained in ED.  Unfortunately sample was not sent for culture.  No eye pain or vision change.  EOMI.  Improving. -De-escalate antibiotics to p.o. Augmentin  and doxycycline .   -Reports history of occipital  cervicalgia with doxycycline  in the past but would like to try again -Likely home on 12/9 if she continues to do well on p.o. Augmentin  and doxycycline .  Anxiety, bipolar disorder and ADHD: Stable -Continue home meds.  Ascites/nasal congestion - Claritin , saline nose spray, Flonase  and azelastine  nasal spray  Leukocytosis: Likely due to #1.  Resolved.  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          DVT prophylaxis:  enoxaparin  (LOVENOX ) injection 40 mg Start: 03/06/24 2200 Place TED hose Start: 03/06/24 1426  Code Status: Full code Family Communication: None at the bedside Level of care: Med-Surg Status is: Inpatient Remains inpatient appropriate because: For right eye periorbital cellulitis   Final disposition: Likely home in the next 24 to 48 hours.   35 minutes with more than 50% spent in reviewing records, counseling patient/family and coordinating care.  Consultants:  None  Procedures: 12/6-I&D of right temporal abscess in ED  Microbiology summarized: None  Objective: Vitals:   03/07/24 1700 03/07/24 2000 03/07/24 2200 03/08/24 0824  BP: 114/65 118/81 106/74 111/72  Pulse: (!) 57  79 72  Resp: 18 16 16 17   Temp: 98.2 F (36.8 C) 97.7 F (36.5 C) 97.6 F (36.4 C) 97.8 F (36.6 C)  TempSrc: Axillary Oral Oral Oral  SpO2: 100% 100% 100% 100%  Weight:      Height:        Examination:  GENERAL: No apparent distress.  Nontoxic. HEENT: MMM.  Vision intact.  EOMI.  PERRL but right pupil lightly greater than left (chronic).  Erythema and  swelling over right temple and around right eye. NECK: Supple.  No apparent JVD.  RESP:  No IWOB.  Fair aeration bilaterally. CVS:  RRR. Heart sounds normal.  ABD/GI/GU: BS+. Abd soft, NTND.  MSK/EXT:  Moves extremities. No apparent deformity. No edema.  SKIN: As above.  See pictures below for more.  NEURO: AA.  Oriented appropriately.  No apparent focal neuro  deficit. PSYCH: Calm. Normal affect.   03/06/2024    03/07/2024  Sch Meds:  Scheduled Meds:  amoxicillin -clavulanate  1 tablet Oral Q12H   amphetamine -dextroamphetamine   20 mg Oral TID   ARIPiprazole   30 mg Oral QHS   azelastine   2 spray Each Nare BID   doxycycline   100 mg Oral Q12H   enoxaparin  (LOVENOX ) injection  40 mg Subcutaneous Q24H   fluticasone   2 spray Each Nare Daily   guanFACINE   2 mg Oral q morning   ketotifen   1 drop Both Eyes BID   loratadine   10 mg Oral Daily   valACYclovir   500 mg Oral Daily   Continuous Infusions:   PRN Meds:.acetaminophen  **OR** acetaminophen , clonazePAM , hydrALAZINE , morphine  injection, ondansetron  **OR** ondansetron  (ZOFRAN ) IV, senna-docusate, sodium chloride   Antimicrobials: Anti-infectives (From admission, onward)    Start     Dose/Rate Route Frequency Ordered Stop   03/08/24 1230  valACYclovir  (VALTREX ) tablet 500 mg       Note to Pharmacy: OP SIG:Can increase to twice a day for 5 days in the event of a recurrence Patient taking differently: Take 1 tablet (500mg ) by mouth at night. May increase to 1 tablet twice daily for 3 days if needed for an outbreak.     500 mg Oral Daily 03/08/24 1136     03/08/24 1045  amoxicillin -clavulanate (AUGMENTIN ) 875-125 MG per tablet 1 tablet        1 tablet Oral Every 12 hours 03/08/24 0946     03/08/24 1045  doxycycline  (VIBRA -TABS) tablet 100 mg        100 mg Oral Every 12 hours 03/08/24 0947     03/07/24 2000  metroNIDAZOLE  (FLAGYL ) tablet 500 mg  Status:  Discontinued        500 mg Oral Every 12 hours 03/07/24 0940 03/08/24 0946   03/06/24 2200  vancomycin  (VANCOCIN ) IVPB 1000 mg/200 mL premix  Status:  Discontinued        1,000 mg 200 mL/hr over 60 Minutes Intravenous Every 12 hours 03/06/24 1307 03/08/24 0947   03/06/24 1800  ceFEPIme  (MAXIPIME ) 2 g in sodium chloride  0.9 % 100 mL IVPB  Status:  Discontinued        2 g 200 mL/hr over 30 Minutes Intravenous Every 8 hours 03/06/24 1307  03/08/24 0946   03/06/24 1600  metroNIDAZOLE  (FLAGYL ) IVPB 500 mg  Status:  Discontinued        500 mg 100 mL/hr over 60 Minutes Intravenous Every 12 hours 03/06/24 1512 03/07/24 0939   03/06/24 0945  piperacillin -tazobactam (ZOSYN ) IVPB 3.375 g        3.375 g 100 mL/hr over 30 Minutes Intravenous  Once 03/06/24 0935 03/06/24 1219   03/06/24 0930  vancomycin  (VANCOCIN ) IVPB 1000 mg/200 mL premix        1,000 mg 200 mL/hr over 60 Minutes Intravenous  Once 03/06/24 0924 03/06/24 1205   03/06/24 0930  piperacillin -tazobactam (ZOSYN ) IVPB 4.5 g  Status:  Discontinued        4.5 g 200 mL/hr over 30 Minutes Intravenous  Once 03/06/24 0924 03/06/24 0935  I have personally reviewed the following labs and images: CBC: Recent Labs  Lab 03/06/24 0857 03/07/24 0537 03/08/24 0908  WBC 8.9 13.0* 6.1  NEUTROABS 5.6  --   --   HGB 13.4 12.5 12.8  HCT 38.8 34.7* 37.4  MCV 97.5 95.3 97.1  PLT 235 179 181   BMP &GFR Recent Labs  Lab 03/06/24 0857 03/07/24 0537 03/08/24 0908  NA 137 139 142  K 4.3 3.6 3.7  CL 106 109 111  CO2 25 24 25   GLUCOSE 98 97 96  BUN 8 5* 8  CREATININE 0.69 0.63 0.77  CALCIUM 8.3* 7.6* 8.0*  MG  --   --  1.9  PHOS  --   --  2.5   Estimated Creatinine Clearance: 91.7 mL/min (by C-G formula based on SCr of 0.77 mg/dL). Liver & Pancreas: Recent Labs  Lab 03/08/24 0908  ALBUMIN 2.6*   No results for input(s): LIPASE, AMYLASE in the last 168 hours. No results for input(s): AMMONIA in the last 168 hours. Diabetic: No results for input(s): HGBA1C in the last 72 hours. No results for input(s): GLUCAP in the last 168 hours. Cardiac Enzymes: No results for input(s): CKTOTAL, CKMB, CKMBINDEX, TROPONINI in the last 168 hours. No results for input(s): PROBNP in the last 8760 hours. Coagulation Profile: No results for input(s): INR, PROTIME in the last 168 hours. Thyroid  Function Tests: No results for input(s): TSH, T4TOTAL,  FREET4, T3FREE, THYROIDAB in the last 72 hours. Lipid Profile: No results for input(s): CHOL, HDL, LDLCALC, TRIG, CHOLHDL, LDLDIRECT in the last 72 hours. Anemia Panel: No results for input(s): VITAMINB12, FOLATE, FERRITIN, TIBC, IRON, RETICCTPCT in the last 72 hours. Urine analysis:    Component Value Date/Time   COLORURINE YELLOW 04/17/2020 1329   APPEARANCEUR CLEAR 04/17/2020 1329   APPEARANCEUR Clear 11/27/2017 1825   LABSPEC 1.020 04/17/2020 1329   PHURINE 6.0 04/17/2020 1329   GLUCOSEU NEGATIVE 04/17/2020 1329   HGBUR NEGATIVE 04/17/2020 1329   BILIRUBINUR NEGATIVE 04/17/2020 1329   BILIRUBINUR Negative 11/27/2017 1825   KETONESUR NEGATIVE 04/17/2020 1329   PROTEINUR NEGATIVE 04/17/2020 1329   UROBILINOGEN 0.2 12/07/2012 1237   NITRITE NEGATIVE 04/17/2020 1329   LEUKOCYTESUR TRACE (A) 04/17/2020 1329   Sepsis Labs: Invalid input(s): PROCALCITONIN, LACTICIDVEN  Microbiology: No results found for this or any previous visit (from the past 240 hours).  Radiology Studies: No results found.     Katrina Harper T. Hettie Roselli [facility]  If 7PM-7AM, please contact night-coverage [email or web address] 03/08/2024, 12:34 PM

## 2024-03-09 ENCOUNTER — Other Ambulatory Visit (HOSPITAL_COMMUNITY): Payer: Self-pay

## 2024-03-09 LAB — RENAL FUNCTION PANEL
Albumin: 2.5 g/dL — ABNORMAL LOW (ref 3.5–5.0)
Anion gap: 7 (ref 5–15)
BUN: 7 mg/dL (ref 6–20)
CO2: 25 mmol/L (ref 22–32)
Calcium: 8.1 mg/dL — ABNORMAL LOW (ref 8.9–10.3)
Chloride: 106 mmol/L (ref 98–111)
Creatinine, Ser: 0.69 mg/dL (ref 0.44–1.00)
GFR, Estimated: >60 mL/min (ref >60)
Glucose, Bld: 111 mg/dL — ABNORMAL HIGH (ref 70–99)
Phosphorus: 4.3 mg/dL (ref 2.5–4.6)
Potassium: 3.9 mmol/L (ref 3.5–5.1)
Sodium: 138 mmol/L (ref 135–145)

## 2024-03-09 LAB — CBC
HCT: 33.6 % — ABNORMAL LOW (ref 36.0–46.0)
Hemoglobin: 11.7 g/dL — ABNORMAL LOW (ref 12.0–15.0)
MCH: 33.9 pg (ref 26.0–34.0)
MCHC: 34.8 g/dL (ref 30.0–36.0)
MCV: 97.4 fL (ref 80.0–100.0)
Platelets: 209 K/uL (ref 150–400)
RBC: 3.45 MIL/uL — ABNORMAL LOW (ref 3.87–5.11)
RDW: 12.7 % (ref 11.5–15.5)
WBC: 5.3 K/uL (ref 4.0–10.5)
nRBC: 0 % (ref 0.0–0.2)

## 2024-03-09 MED ORDER — DOXYCYCLINE HYCLATE 100 MG PO TABS
100.0000 mg | ORAL_TABLET | Freq: Two times a day (BID) | ORAL | 0 refills | Status: AC
  Filled 2024-03-09: qty 14, 7d supply, fill #0

## 2024-03-09 MED ORDER — AMOXICILLIN-POT CLAVULANATE 875-125 MG PO TABS
1.0000 | ORAL_TABLET | Freq: Two times a day (BID) | ORAL | 0 refills | Status: AC
  Filled 2024-03-09: qty 14, 7d supply, fill #0

## 2024-03-09 NOTE — Plan of Care (Signed)

## 2024-03-09 NOTE — Discharge Summary (Signed)
 Physician Discharge Summary  Jennika Ringgold FMW:[id number] DOB: 17-Jun-1975 DOA: 03/06/2024  PCP: Corlis Pagan, NP  Admit date: 03/06/2024 Discharge date: 03/09/24  Admitted From: Home Disposition: Home Recommendations for Outpatient Follow-up:  Outpatient follow-up with PCP in 1 to 2 weeks. Check CBC and CMP at follow-up Please follow up on the following pending results: None  Home Health: No need identified Equipment/Devices: No need identified  Discharge Condition: Stable CODE STATUS: Full code   Follow-up Information     Corlis Pagan, NP. Schedule an appointment as soon as possible for a visit in 1 week(s).   Contact information: [address] [place] [address] [phone]                 Hospital course 48 year old F with PMH of anxiety, bipolar disorder and ADHD presented to [facility] ED with right eye swelling and redness, and admitted with working diagnosis of preorbital cellulitis.  Patient noted an ingrown hair or cystic acne on her right temple that she popped on 12/1.  Then she noticed erythema and swelling on her right temple and went to urgent care on 12/4.  She was prescribed clindamycin .  She woke up with increased swelling and redness that has spread to right eyelids on 12/6 that prompted her to come to ED.  She had difficulty opening her right eye.   In ED, stable vitals.  BMP and CBC without significant finding.  Pregnancy test negative.  CT orbits with contrast showed right periorbital cellulitis with extensive skin thickening and subcutaneous stranding extending into the right temporal area but no postseptal inflammatory change or focal abscess.  She had right temporal abscess drained in ED. unfortunately abscess culture was not sent.  Patient was started on broad-spectrum antibiotic, and admitted.   MRI orbits with and without contrast confirmed CT finding.  Patient's symptoms improving with broad-spectrum antibiotics.   Antibiotic de-escalated to Augmentin  and doxycycline .  She continued to improve.  She is discharged on p.o. doxycycline  and Augmentin  for 7 more days to complete treatment course.  See individual problem list below for more.   Problems addressed during this hospitalization Preorbital cellulitis of right eye: Failed outpatient treatment with p.o. clindamycin .  Had small temporal abscess drained in ED.  Unfortunately sample was not sent for culture.  No eye pain or vision change.  EOMI. Cellulitis improved.  Antibiotic de-escalated to Augmentin  and doxycycline  and she continues to improve.  Discharged on p.o. Augmentin  and doxycycline  for 7 more days to complete treatment course.  Outpatient follow-up with PCP as above  Anxiety, bipolar disorder and ADHD: Stable -Continue home meds.   Ascites/nasal congestion - Continue home meds.   Leukocytosis: Likely due to #1.  Resolved.   History of HSV -Continue home Valtrex .  Hypogonadism -Continue home meds  Body mass index is 26.51 kg/m.           Consultations: None  Time spent 35  minutes  Vital signs Vitals:   03/08/24 1616 03/08/24 1918 03/09/24 0436 03/09/24 0730  BP: 128/78 119/78 104/67 112/76  Pulse: 71 69 60 73  Temp: 97.9 F (36.6 C) 98.5 F (36.9 C) 97.7 F (36.5 C) 97.8 F (36.6 C)  Resp: 17 18 16    Height:      Weight:      SpO2: 100% 100% 99% 100%  TempSrc: Oral Oral Oral Oral  BMI (Calculated):         Discharge exam  GENERAL: No apparent distress.  Nontoxic.  HEENT: MMM.  EOMI.  PERRL.  Vision grossly intact.  Significantly improved right temporal and periorbital cellulitis.  See picture under media for more. NECK: Supple.  No apparent JVD.  RESP:  No IWOB.  Fair aeration bilaterally. CVS:  RRR. Heart sounds normal.  ABD/GI/GU: BS+. Abd soft, NTND.  MSK/EXT:  Moves extremities. No apparent deformity. No edema.  SKIN: As above. NEURO: Awake and alert. Oriented appropriately.  No apparent focal neuro  deficit. PSYCH: Calm. Normal affect.   Discharge Instructions Discharge Instructions     Discharge instructions   Complete by: As directed    It has been a pleasure taking care of you!  You were hospitalized due to preorbital cellulitis for which you have been treated with antibiotics.  Your symptoms improved.  We are discharging you on oral antibiotics to complete treatment course.  Is very important that you complete the whole course of antibiotics regardless of improvement.   Avoid dairy products and excessive sun exposure while taking doxycycline . Follow-up with your primary care doctor in 1 to 2 weeks or sooner if needed.    Take care,   Increase activity slowly   Complete by: As directed    No wound care   Complete by: As directed       Allergies as of 03/09/2024       Reactions   Lithobid [lithium] Other (See Comments)   Change in mood, makes her toxic   Ortho Tri-cyclen [norgestimate-eth Estradiol] Other (See Comments)   Affects mood, aggravates bipolar   Symbyax [olanzapine-fluoxetine Hcl] Other (See Comments)   Extreme weight gain Affects mood, negatively   Vibramycin  [doxycycline  Hyclate] Other (See Comments)   Severe headaches reaction to doxycycline  hyclate capsules   Lamictal [lamotrigine] Rash   Lamictal rash        Medication List     STOP taking these medications    clindamycin  300 MG capsule Commonly known as: CLEOCIN        TAKE these medications    amoxicillin -clavulanate 875-125 MG tablet Commonly known as: AUGMENTIN  Take 1 tablet by mouth every 12 (twelve) hours for 7 days.   amphetamine -dextroamphetamine  20 MG tablet Commonly known as: ADDERALL Take 20 mg by mouth in the morning, at noon, and at bedtime.   ARIPiprazole  30 MG tablet Commonly known as: ABILIFY  Take 30 mg by mouth at bedtime.   azelastine  0.05 % ophthalmic solution Commonly known as: OPTIVAR  Place 1 drop into both eyes 3 (three) times daily.   azelastine  0.1 %  nasal spray Commonly known as: ASTELIN  Place 2 sprays into both nostrils 2 (two) times daily.   clonazePAM  1 MG tablet Commonly known as: KLONOPIN  Take 1-1.5 mg by mouth See admin instructions. Take 1-1.5 tablets (1-1.5mg ) by mouth at bedtime. May also take 1 tablet every 6 hours as needed for anxiety. Maximum of 4 tablets (4mg ) per day.   doxycycline  100 MG tablet Commonly known as: VIBRA -TABS Take 1 tablet (100 mg total) by mouth every 12 (twelve) hours for 7 days.   ESTROGENS CONJUGATED IJ Inject 0.5 mLs into the muscle See admin instructions. Inject 0.5mL into the muscle twice a week on Tuesday and Friday.   FISH OIL PO Take 1 capsule by mouth daily.   fluticasone  50 MCG/ACT nasal spray Commonly known as: FLONASE  PLACE 1 SPRAY INTO BOTH NOSTRILS DAILY.   GLUTAMINE PO Take 1 tablet by mouth daily.   guanFACINE  2 MG Tb24 ER tablet Commonly known as: INTUNIV  Take 2 mg by mouth daily.  ibuprofen  600 MG tablet Commonly known as: ADVIL  TAKE 1 TABLET (600 MG TOTAL) BY MOUTH EVERY 6 (SIX) HOURS AS NEEDED. What changed: Another medication with the same name was removed. Continue taking this medication, and follow the directions you see here.   levocetirizine 5 MG tablet Commonly known as: XYZAL  Take 1 tablet (5 mg total) by mouth every evening. What changed: when to take this   MINOXIDIL (TOPICAL) 5 % Soln Apply 1 application  topically at bedtime.   PARAGARD  INTRAUTERINE COPPER  IU 1 Intra Uterine Device by Intrauterine route once.   PROGESTERONE PO Take 1 capsule by mouth at bedtime.   TESTOSTERONE IM Inject 0.2 mLs into the muscle See admin instructions. Inject 0.2mL into the muscle twice a week on Tuesday and Friday.   tretinoin 0.1 % cream Commonly known as: RETIN-A Apply 1 application  topically See admin instructions. Apply a small amount to face at bedtime 3 days a week on Monday, Wednesday and Friday.   triamcinolone  cream 0.1 % Commonly known as:  KENALOG  Apply 1 Application topically 2 (two) times daily as needed (skin irritation, itching).   valACYclovir  500 MG tablet Commonly known as: VALTREX  Take 1 tablet (500 mg total) by mouth daily. Can increase to twice a day for 5 days in the event of a recurrence What changed:  when to take this additional instructions         Procedures/Studies   MR ORBITS W WO CONTRAST Result Date: 03/06/2024 EXAM: MRI ORBITS WITHOUT AND WITH CONTRAST 03/06/2024 04:07:51 PM TECHNIQUE: Multiplanar, multisequence MRI of the orbits was performed without and with intravenous contrast. 7.5 mL (gadobutrol  (GADAVIST ) 1 MMOL/ML injection 7.5 mL GADOBUTROL  1 MMOL/ML IV SOLN) was administered. COMPARISON: None available CLINICAL HISTORY: Periorbital cellulitis FINDINGS: ORBITS: Underlying globe is within normal limits. Lenses are normally located. Symmetric caliber and signal of the optic nerves. Symmetric extraocular muscles. No orbital mass. No abnormal enhancement. No post septal inflammatory changes are present. SINUSES AND MASTOIDS: Clear. BRAIN: No acute abnormality. BONES AND SOFT TISSUES: Normal bone marrow signal. Diffuse subcutaneous edema and skin thickening is present with the right face and periorbital region. IMPRESSION: 1. Right periorbital and facial subcutaneous edema and skin thickening, consistent with periorbital cellulitis. 2. No postseptal inflammatory changes. Electronically signed by: Lonni Necessary MD 03/06/2024 06:14 PM EST RP Workstation: [id number]   CT Orbits W Contrast Result Date: 03/06/2024 EXAM: CT ORBITS WITH CONTRAST 03/06/2024 09:55:10 AM TECHNIQUE: CT of the orbits was performed with the administration of 75 mL of iohexol  (OMNIPAQUE ) 300 MG/ML solution. Multiplanar reformatted images are provided for review. Automated exposure control, iterative reconstruction, and/or weight based adjustment of the mA/kV was utilized to reduce the radiation dose to as low as reasonably  achievable. COMPARISON: CT without contrast 07/22/2006. CLINICAL HISTORY: Possible orbital cellulitis. FINDINGS: ORBITS: Globes are intact. Normal extraocular muscles. Normal optic nerve-sheath complexes. No hematoma or inflammatory change. No mass. No post septal inflammatory changes are present. No focal abscess is present. SOFT TISSUES: Right paraorbital edema. Paraorbital cellulitis. Extensive skin thickening and subcutaneous stranding is present over the right side of the face extending into the right temporal area. Periorbital soft tissue swelling is present. SINUSES AND MASTOIDS: No acute abnormality. BONES: No acute abnormality. IMPRESSION: 1. Right periorbital cellulitis with extensive skin thickening and subcutaneous stranding extending into the right temporal area. 2. No postseptal inflammatory changes or focal abscess. Electronically signed by: Lonni Necessary MD 03/06/2024 10:27 AM EST RP Workstation: [id number]  The results of significant diagnostics from this hospitalization (including imaging, microbiology, ancillary and laboratory) are listed below for reference.     Microbiology: No results found for this or any previous visit (from the past 240 hours).   Labs:  CBC: Recent Labs  Lab 03/06/24 0857 03/07/24 0537 03/08/24 0908 03/09/24 0130  WBC 8.9 13.0* 6.1 5.3  NEUTROABS 5.6  --   --   --   HGB 13.4 12.5 12.8 11.7*  HCT 38.8 34.7* 37.4 33.6*  MCV 97.5 95.3 97.1 97.4  PLT 235 179 181 209   BMP &GFR Recent Labs  Lab 03/06/24 0857 03/07/24 0537 03/08/24 0908 03/09/24 0130  NA 137 139 142 138  K 4.3 3.6 3.7 3.9  CL 106 109 111 106  CO2 25 24 25 25   GLUCOSE 98 97 96 111*  BUN 8 5* 8 7  CREATININE 0.69 0.63 0.77 0.69  CALCIUM 8.3* 7.6* 8.0* 8.1*  MG  --   --  1.9  --   PHOS  --   --  2.5 4.3   Estimated Creatinine Clearance: 91.7 mL/min (by C-G formula based on SCr of 0.69 mg/dL). Liver & Pancreas: Recent Labs  Lab 03/08/24 0908 03/09/24 0130   ALBUMIN 2.6* 2.5*   No results for input(s): LIPASE, AMYLASE in the last 168 hours. No results for input(s): AMMONIA in the last 168 hours. Diabetic: No results for input(s): HGBA1C in the last 72 hours. No results for input(s): GLUCAP in the last 168 hours. Cardiac Enzymes: No results for input(s): CKTOTAL, CKMB, CKMBINDEX, TROPONINI in the last 168 hours. No results for input(s): PROBNP in the last 8760 hours. Coagulation Profile: No results for input(s): INR, PROTIME in the last 168 hours. Thyroid  Function Tests: No results for input(s): TSH, T4TOTAL, FREET4, T3FREE, THYROIDAB in the last 72 hours. Lipid Profile: No results for input(s): CHOL, HDL, LDLCALC, TRIG, CHOLHDL, LDLDIRECT in the last 72 hours. Anemia Panel: No results for input(s): VITAMINB12, FOLATE, FERRITIN, TIBC, IRON, RETICCTPCT in the last 72 hours. Urine analysis:    Component Value Date/Time   COLORURINE YELLOW 04/17/2020 1329   APPEARANCEUR CLEAR 04/17/2020 1329   APPEARANCEUR Clear 11/27/2017 1825   LABSPEC 1.020 04/17/2020 1329   PHURINE 6.0 04/17/2020 1329   GLUCOSEU NEGATIVE 04/17/2020 1329   HGBUR NEGATIVE 04/17/2020 1329   BILIRUBINUR NEGATIVE 04/17/2020 1329   BILIRUBINUR Negative 11/27/2017 1825   KETONESUR NEGATIVE 04/17/2020 1329   PROTEINUR NEGATIVE 04/17/2020 1329   UROBILINOGEN 0.2 12/07/2012 1237   NITRITE NEGATIVE 04/17/2020 1329   LEUKOCYTESUR TRACE (A) 04/17/2020 1329   Sepsis Labs: Invalid input(s): PROCALCITONIN, LACTICIDVEN   SIGNED:  [place] Dodgen T Reggie Bise, MD  [facility] 03/09/2024, 12:23 PM
